# Patient Record
Sex: Male | Born: 1935 | Race: White | Hispanic: No | Marital: Married | Smoking: Former smoker
Health system: Southern US, Community
[De-identification: ages and names within clinical notes are randomized; demographics above are authoritative.]

## PROBLEM LIST (undated history)

## (undated) DIAGNOSIS — J939 Pneumothorax, unspecified: Secondary | ICD-10-CM

## (undated) DIAGNOSIS — M159 Polyosteoarthritis, unspecified: Secondary | ICD-10-CM

## (undated) DIAGNOSIS — N400 Enlarged prostate without lower urinary tract symptoms: Secondary | ICD-10-CM

## (undated) DIAGNOSIS — J479 Bronchiectasis, uncomplicated: Secondary | ICD-10-CM

## (undated) HISTORY — DX: Bronchiectasis, uncomplicated: J47.9

## (undated) HISTORY — PX: CATARACT EXTRACTION W/ INTRAOCULAR LENS  IMPLANT, BILATERAL: SHX1307

## (undated) HISTORY — DX: Benign prostatic hyperplasia without lower urinary tract symptoms: N40.0

## (undated) HISTORY — PX: TOTAL HIP ARTHROPLASTY: SHX124

## (undated) HISTORY — DX: Polyosteoarthritis, unspecified: M15.9

## (undated) HISTORY — DX: Pneumothorax, unspecified: J93.9

## (undated) HISTORY — PX: UMBILICAL HERNIA REPAIR: SHX196

## (undated) HISTORY — PX: TONSILLECTOMY: SUR1361

---

## 1966-09-29 HISTORY — PX: MENISCUS REPAIR: SHX5179

## 1993-09-29 DIAGNOSIS — J939 Pneumothorax, unspecified: Secondary | ICD-10-CM

## 1993-09-29 HISTORY — DX: Pneumothorax, unspecified: J93.9

## 2013-06-28 ENCOUNTER — Encounter: Payer: Self-pay | Admitting: Internal Medicine

## 2013-06-28 ENCOUNTER — Ambulatory Visit (INDEPENDENT_AMBULATORY_CARE_PROVIDER_SITE_OTHER): Payer: Medicare HMO | Admitting: Internal Medicine

## 2013-06-28 ENCOUNTER — Ambulatory Visit (INDEPENDENT_AMBULATORY_CARE_PROVIDER_SITE_OTHER)
Admission: RE | Admit: 2013-06-28 | Discharge: 2013-06-28 | Disposition: A | Discharge status: Home / Self Care | Payer: Medicare HMO | Source: Ambulatory Visit | Attending: Internal Medicine | Admitting: Internal Medicine

## 2013-06-28 VITALS — BP 140/70 | HR 89 | Temp 98.0°F | Resp 16 | Ht 70.0 in | Wt 203.0 lb

## 2013-06-28 DIAGNOSIS — J189 Pneumonia, unspecified organism: Secondary | ICD-10-CM | POA: Insufficient documentation

## 2013-06-28 DIAGNOSIS — R200 Anesthesia of skin: Secondary | ICD-10-CM

## 2013-06-28 DIAGNOSIS — M159 Polyosteoarthritis, unspecified: Secondary | ICD-10-CM | POA: Insufficient documentation

## 2013-06-28 DIAGNOSIS — J479 Bronchiectasis, uncomplicated: Secondary | ICD-10-CM | POA: Insufficient documentation

## 2013-06-28 DIAGNOSIS — R209 Unspecified disturbances of skin sensation: Secondary | ICD-10-CM

## 2013-06-28 DIAGNOSIS — J471 Bronchiectasis with (acute) exacerbation: Secondary | ICD-10-CM

## 2013-06-28 LAB — CBC WITH DIFFERENTIAL/PLATELET
Basophils Absolute: 0 10*3/uL (ref 0.0–0.1)
Eosinophils Relative: 3.9 % (ref 0.0–5.0)
MCV: 86.4 fl (ref 78.0–100.0)
Monocytes Absolute: 0.8 10*3/uL (ref 0.1–1.0)
Monocytes Relative: 10.4 % (ref 3.0–12.0)
Neutrophils Relative %: 69.7 % (ref 43.0–77.0)
Platelets: 304 10*3/uL (ref 150.0–400.0)
RDW: 14.1 % (ref 11.5–14.6)
WBC: 7.4 10*3/uL (ref 4.5–10.5)

## 2013-06-28 LAB — BASIC METABOLIC PANEL
BUN: 13 mg/dL (ref 6–23)
CO2: 25 mEq/L (ref 19–32)
Chloride: 103 mEq/L (ref 96–112)
Creatinine, Ser: 1 mg/dL (ref 0.4–1.5)
GFR: 73.18 mL/min (ref >60.00)
Potassium: 3.3 mEq/L — ABNORMAL LOW (ref 3.5–5.1)

## 2013-06-28 LAB — HEPATIC FUNCTION PANEL
AST: 21 U/L (ref 0–37)
Bilirubin, Direct: 0.1 mg/dL (ref 0.0–0.3)
Total Bilirubin: 0.6 mg/dL (ref 0.3–1.2)

## 2013-06-28 LAB — TSH: TSH: 0.82 u[IU]/mL (ref 0.35–5.50)

## 2013-06-28 LAB — VITAMIN B12: Vitamin B-12: 274 pg/mL (ref 211–911)

## 2013-06-28 MED ORDER — LEVOFLOXACIN 500 MG PO TABS: 500.0000 mg | ORAL_TABLET | Freq: Every day | ORAL | Status: DC

## 2013-06-28 MED ORDER — FLUTICASONE-SALMETEROL 250-50 MCG/DOSE IN AEPB: 1.0000 | INHALATION_SPRAY | Freq: Two times a day (BID) | RESPIRATORY_TRACT | Status: DC

## 2013-06-28 MED ORDER — DOXYCYCLINE MONOHYDRATE 100 MG PO TABS: 100.0000 mg | ORAL_TABLET | Freq: Two times a day (BID) | ORAL | Status: DC

## 2013-06-28 NOTE — Patient Instructions (Addendum)
Call if you are not better in the next couple of days.

## 2013-06-28 NOTE — Assessment & Plan Note (Signed)
Will check labs

## 2013-06-28 NOTE — Assessment & Plan Note (Addendum)
Has small infiltrate in LLL Other more chronic changes  Will change the antibiotic to levaquin Add doxy because of the bronchiectasis

## 2013-06-28 NOTE — Progress Notes (Signed)
Subjective:    Patient ID: Spencer Webster, male    DOB: 22-Jun-1928, 79 y.o.   MRN: [id number]  HPI Here with wife and service dog--- hearing Moved here ~2 months ago from [place]  Has chronic bronchiectasis  Pneumonia at age 10 Believes he got lung damage from this Diagnosed in ?72's Has albuterol for rescue On spiriva and advair daily--inconsistent with the spiriva cipro prescribed by [facility] doctor 4 days ago (was up there in [place]) Prone to pneumonia Did have sweat after starting the antibiotic Fever recurred next day and yesterday Still with some sweats at night Concerned that he is not improving like he normally does  No current outpatient prescriptions on file prior to visit.   No current facility-administered medications on file prior to visit.    Allergies  Allergen Reactions  . Morphine And Related   . Penicillins     Past Medical History  Diagnosis Date  . Bronchiectasis   . Osteoarthritis, multiple sites     Past Surgical History  Procedure Laterality Date  . Total hip arthroplasty  1999 & 2008  . Meniscus repair Left 1968  . Tonsillectomy      Family History  Problem Relation Age of Onset  . Cancer Father   . Heart disease Neg Hx   . Diabetes Neg Hx   . Hypertension Neg Hx     History   Social History  . Marital Status: Married    Spouse Name: N/A    Number of Children: 0  . Years of Education: N/A   Occupational History  . Sales of electronics     Retired 2013   Social History Main Topics  . Smoking status: Never Smoker   . Smokeless tobacco: Never Used  . Alcohol Use: No  . Drug Use: No  . Sexual Activity: Not on file   Other Topics Concern  . Not on file   Social History Narrative   No advanced directives   Review of Systems  Constitutional: Positive for fatigue and unexpected weight change.       Has lost some weight with this illness  HENT: Positive for hearing loss. Negative for tinnitus.   Eyes: Negative for visual  disturbance.       Wears reading glasses  Respiratory: Positive for cough and shortness of breath.   Cardiovascular: Negative for chest pain and palpitations.  Gastrointestinal: Negative for nausea and vomiting.       Bowels are "erratic"  Genitourinary: Negative for urgency, frequency and difficulty urinating.  Musculoskeletal: Positive for arthralgias. Negative for back pain.       Lots of knee pain  Skin: Negative for rash.  Neurological: Positive for numbness. Negative for dizziness, syncope, light-headedness and headaches.       Some numbness in feet  Psychiatric/Behavioral: Negative for sleep disturbance and dysphoric mood. The patient is not nervous/anxious.        Objective:   Physical Exam  Constitutional: He appears well-developed and well-nourished. No distress.  Neck: Normal range of motion. Neck supple.  Cardiovascular: Normal rate, regular rhythm and normal heart sounds.  Exam reveals no gallop.   No murmur heard. Pulmonary/Chest: Effort normal and breath sounds normal. No respiratory distress. He has no wheezes.  Very slight bibasilar crackles  Abdominal: Soft. There is no tenderness.  Musculoskeletal: He exhibits no edema and no tenderness.  Lymphadenopathy:    He has no cervical adenopathy.  Skin: No rash noted.  Psychiatric: He has a normal mood and  affect. His behavior is normal.          Assessment & Plan:

## 2013-06-28 NOTE — Assessment & Plan Note (Signed)
Seems to have secondary pneumonia also Will use doxy now Refill the advair

## 2013-06-28 NOTE — Assessment & Plan Note (Signed)
Especially bad in knees Uses aleve just prn Uses glucosamine/chondroitin

## 2013-06-29 NOTE — Addendum Note (Signed)
Addended by: Tillman Abide I on: 06/29/2013 08:43 AM   Modules accepted: Orders

## 2013-07-01 ENCOUNTER — Encounter: Payer: Self-pay | Admitting: *Deleted

## 2013-07-05 ENCOUNTER — Telehealth: Payer: Self-pay

## 2013-07-05 NOTE — Telephone Encounter (Signed)
Mrs South said pt was 06/28/13 with pneumonia; pt is still taking antibiotics; pt appeared to improve; but 07/05/13 during the night pt woke up with tshirt wet with sweat; temp was not taken. Pt does not feel warm now. Prod cough with white phlegm, no SOB more than usual. No CP. Mrs Oelkers concerned that woke up during the night sweating; thinks pt had fever last night. [facility] [address]

## 2013-07-05 NOTE — Telephone Encounter (Signed)
Probably needs to come in for recheck Add on tomorrow at this point He should continue the antibiotics

## 2013-07-06 NOTE — Telephone Encounter (Signed)
Pt coming in for OV 07/07/13

## 2013-07-07 ENCOUNTER — Encounter: Payer: Self-pay | Admitting: Internal Medicine

## 2013-07-07 ENCOUNTER — Ambulatory Visit (INDEPENDENT_AMBULATORY_CARE_PROVIDER_SITE_OTHER): Payer: Medicare HMO | Admitting: Internal Medicine

## 2013-07-07 VITALS — BP 112/68 | HR 86 | Temp 98.1°F | Wt 203.2 lb

## 2013-07-07 DIAGNOSIS — J479 Bronchiectasis, uncomplicated: Secondary | ICD-10-CM

## 2013-07-07 MED ORDER — LEVOFLOXACIN 500 MG PO TABS: 500.0000 mg | ORAL_TABLET | Freq: Every day | ORAL | Status: DC

## 2013-07-07 MED ORDER — PREDNISONE 20 MG PO TABS: 40.0000 mg | ORAL_TABLET | Freq: Every day | ORAL | Status: DC

## 2013-07-07 NOTE — Patient Instructions (Signed)
Please try guafenisin to loosen up the mucous If you worsen, with more sputum, go ahead and start the levafloxacin

## 2013-07-07 NOTE — Assessment & Plan Note (Signed)
Never quite got better but not clearly infected Will have him try mucolytic levaquin Rx if he worsens Brief prednisone dose-- has slight wheeze and ?small airway disease based on spirometry Will proceed with pulmonary referral

## 2013-07-07 NOTE — Progress Notes (Signed)
  Subjective:    Patient ID: Spencer Webster, male    DOB: 05-31-1928, 78 y.o.   MRN: [id number]  HPI Here with wife and service dog  Still getting sweats at night--but mild Usually produces a lot of sputum---but hasn't lately Feels heavy in the chest Getting easy dyspnea  May have gotten briefly better with the antibiotics after the last visit Coughing less-- "because I can't get it up" Hasn't used any mucolytics  No recorded fever but seems warm to wife May go up to 100 in evening  Current Outpatient Prescriptions on File Prior to Visit  Medication Sig Dispense Refill  . albuterol (PROAIR HFA) 108 (90 BASE) MCG/ACT inhaler Inhale 2 puffs into the lungs every 6 (six) hours as needed for wheezing.      Marland Kitchen doxycycline (ADOXA) 100 MG tablet Take 1 tablet (100 mg total) by mouth 2 (two) times daily.  20 tablet  0  . Fluticasone-Salmeterol (ADVAIR) 250-50 MCG/DOSE AEPB Inhale 1 puff into the lungs every 12 (twelve) hours.  60 each  11  . tiotropium (SPIRIVA) 18 MCG inhalation capsule Place 18 mcg into inhaler and inhale daily.       No current facility-administered medications on file prior to visit.    Allergies  Allergen Reactions  . Morphine And Related   . Penicillins     Past Medical History  Diagnosis Date  . Bronchiectasis   . Osteoarthritis, multiple sites     Past Surgical History  Procedure Laterality Date  . Total hip arthroplasty  1999 & 2008  . Meniscus repair Left 1968  . Tonsillectomy    . Cataract extraction w/ intraocular lens  implant, bilateral      Family History  Problem Relation Age of Onset  . Cancer Father   . Heart disease Neg Hx   . Diabetes Neg Hx   . Hypertension Neg Hx     History   Social History  . Marital Status: Married    Spouse Name: N/A    Number of Children: 0  . Years of Education: N/A   Occupational History  . Sales of electronics     Retired 2013   Social History Main Topics  . Smoking status: Never Smoker   .  Smokeless tobacco: Never Used  . Alcohol Use: No  . Drug Use: No  . Sexual Activity: Not on file   Other Topics Concern  . Not on file   Social History Narrative   No advanced directives   Review of Systems Appetite is fair Weight is stable since his last visit    Objective:   Physical Exam  Constitutional: He appears well-developed and well-nourished. No distress.  Pulmonary/Chest: Effort normal. No respiratory distress. He has wheezes. He has rales.  Very faint exp wheeze--doesn't seem tight Faint bibasilar crackles  Musculoskeletal: He exhibits no edema.          Assessment & Plan:

## 2013-07-26 ENCOUNTER — Encounter: Payer: Self-pay | Admitting: Pulmonary Disease

## 2013-07-26 ENCOUNTER — Ambulatory Visit (INDEPENDENT_AMBULATORY_CARE_PROVIDER_SITE_OTHER): Payer: Medicare HMO | Admitting: Pulmonary Disease

## 2013-07-26 VITALS — BP 108/74 | HR 83 | Temp 98.4°F | Ht 70.0 in | Wt 204.0 lb

## 2013-07-26 DIAGNOSIS — J479 Bronchiectasis, uncomplicated: Secondary | ICD-10-CM

## 2013-07-26 NOTE — Progress Notes (Signed)
Subjective:    Patient ID: Spencer Webster, male    DOB: 02/10/1928, 79 y.o.   MRN: [id number]  HPI  This is a very pleasant 79 year old male with bronchiectasis who just moved here from [facility] a few months ago. He is here today to establish care.  He had pneumonia at age 53 which was treated with no antibiotics. He said that he received an oxygen tent and ice baths. Ever since then he has had bronchiectasis. This has been treated with various modalities over the years. Interestingly he says he had something called pneumoperitoneum as a treatment for it as a child. He was also considered that he had a lung removed but fortunately this was never performed. He was treated heavily with penicillin over the years. He's never had a definitive answer for why he has bronchiectasis but has always been assumed that it was because of the episode of pneumonia. He has had multiple sinus infections over the years and recurrent bronchiectasis flares. Fortunately, he has not had to be hospitalized much during his adult life for respiratory problems. He has never had children but does not believe that he is sterile.  Most recently he has been treated with Advair and Spiriva which he thinks helped. He believes that the Advair is better than the Spiriva. He only uses the Spiriva occasionally. He only has to use albuterol about 2-3 times a month. He has tried Symbicort in the past and feels that it is about the same as Advair. When he is at his best he can't walk very far because of shortness of breath. Typically when he is singing at church she sometimes has to rest for a verse long in order to catch his breath. He coughs up sputum on a daily basis. Recently, he had pneumonia and stated that he could not get anything up during that episode.  He has had a collapsed lung in the past which required a chest tube on the right. This has only happened one time.  Typically, he responds well to ciprofloxacin for  bronchiectasis flares.   Past Medical History  Diagnosis Date  . Bronchiectasis   . Osteoarthritis, multiple sites      Family History  Problem Relation Age of Onset  . Cancer Father   . Heart disease Neg Hx   . Diabetes Neg Hx   . Hypertension Neg Hx      History   Social History  . Marital Status: Married    Spouse Name: N/A    Number of Children: 0  . Years of Education: N/A   Occupational History  . Sales of electronics     Retired 2013   Social History Main Topics  . Smoking status: Former Smoker -- 10 years    Types: Cigarettes    Quit date: 09/30/1967  . Smokeless tobacco: Never Used  . Alcohol Use: No  . Drug Use: No  . Sexual Activity: Not on file   Other Topics Concern  . Not on file   Social History Narrative   No advanced directives     Allergies  Allergen Reactions  . Morphine And Related   . Penicillins     Sweats      Outpatient Prescriptions Prior to Visit  Medication Sig Dispense Refill  . albuterol (PROAIR HFA) 108 (90 BASE) MCG/ACT inhaler Inhale 2 puffs into the lungs every 6 (six) hours as needed for wheezing.      . Fluticasone-Salmeterol (ADVAIR) 250-50 MCG/DOSE AEPB Inhale 1  puff into the lungs every 12 (twelve) hours.  60 each  11  . tiotropium (SPIRIVA) 18 MCG inhalation capsule Place 18 mcg into inhaler and inhale daily.      Marland Kitchen doxycycline (ADOXA) 100 MG tablet Take 1 tablet (100 mg total) by mouth 2 (two) times daily.  20 tablet  0  . levofloxacin (LEVAQUIN) 500 MG tablet Take 1 tablet (500 mg total) by mouth daily.  10 tablet  0  . predniSONE (DELTASONE) 20 MG tablet Take 2 tablets (40 mg total) by mouth daily.  9 tablet  0   No facility-administered medications prior to visit.      Review of Systems  Constitutional: Negative for fever, chills, activity change and appetite change.  HENT: Positive for congestion and sinus pressure. Negative for ear pain, hearing loss, postnasal drip, rhinorrhea and sneezing.   Eyes:  Negative for redness, itching and visual disturbance.  Respiratory: Positive for cough. Negative for chest tightness, shortness of breath and wheezing.   Cardiovascular: Negative for chest pain, palpitations and leg swelling.  Gastrointestinal: Negative for nausea, vomiting, abdominal pain, diarrhea, constipation, blood in stool and abdominal distention.  Musculoskeletal: Positive for arthralgias. Negative for gait problem, joint swelling, myalgias, neck pain and neck stiffness.  Skin: Negative for rash.  Neurological: Negative for dizziness, light-headedness, numbness and headaches.  Hematological: Does not bruise/bleed easily.  Psychiatric/Behavioral: Negative for confusion and dysphoric mood.       Objective:   Physical Exam Filed Vitals:   07/26/13 1437  BP: 108/74  Pulse: 83  Temp: 98.4 F (36.9 C)  TempSrc: Oral  Height: 5\' 10"  (1.778 m)  Weight: 204 lb (92.534 kg)  SpO2: 95%    Gen: well appearing, no acute distress HEENT: NCAT, PERRL, EOMi, OP clear, neck supple without masses PULM: Expiratory wheezes bilaterally, few crackles in the bases, good air movement CV: RRR, no mgr, no JVD AB: BS+, soft, nontender, no hsm Ext: warm, no edema, no clubbing, no cyanosis Derm: no rash or skin breakdown Neuro: A&Ox4, CN II-XII intact, strength 5/5 in all 4 extremities  October 2014 simple spirometry consistent with moderate airflow obstruction, ratio 67%, FEV1 2.13 L (68% predicted) 06/28/2013 chest x-ray reviewed, chronic changes likely consistent with bronchiectasis left base greater than right     Assessment & Plan:   Bronchiectasis He appears to have bronchiectasis due to pneumonia as a [age] year old.  I suspect that this is the only etiology, though given his lack of children and chronic sinus symptoms will send CFTR gene mutation for thoroughness.  At this point will not change therapy.  I do want him to provide [place] with a sputum specimen for culture.    Plan -continue  Advair -sputum culture -for now, use Cipro 750mg  po bid x 14 days for exacerbations -hold flutter valve as he uses postural drainage techniques -consider daily azithromycin -f/u 4-6 months   Updated Medication List Outpatient Encounter Prescriptions as of 07/26/2013  Medication Sig Dispense Refill  . albuterol (PROAIR HFA) 108 (90 BASE) MCG/ACT inhaler Inhale 2 puffs into the lungs every 6 (six) hours as needed for wheezing.      . Cholecalciferol (VITAMIN D PO) Take 1 tablet by mouth daily.      . Fluticasone-Salmeterol (ADVAIR) 250-50 MCG/DOSE AEPB Inhale 1 puff into the lungs every 12 (twelve) hours.  60 each  11  . Glucosamine HCl (GLUCOSAMINE PO) Take 1 tablet by mouth daily.      . Multiple Vitamin (MULTIVITAMIN) capsule  Take 1 capsule by mouth daily.      Marland Kitchen tiotropium (SPIRIVA) 18 MCG inhalation capsule Place 18 mcg into inhaler and inhale daily.      . [DISCONTINUED] doxycycline (ADOXA) 100 MG tablet Take 1 tablet (100 mg total) by mouth 2 (two) times daily.  20 tablet  0  . [DISCONTINUED] levofloxacin (LEVAQUIN) 500 MG tablet Take 1 tablet (500 mg total) by mouth daily.  10 tablet  0  . [DISCONTINUED] predniSONE (DELTASONE) 20 MG tablet Take 2 tablets (40 mg total) by mouth daily.  9 tablet  0   No facility-administered encounter medications on file as of 07/26/2013.

## 2013-07-26 NOTE — Assessment & Plan Note (Signed)
He appears to have bronchiectasis due to pneumonia as a [age] year old.  I suspect that this is the only etiology, though given his lack of children and chronic sinus symptoms will send CFTR gene mutation for thoroughness.  At this point will not change therapy.  I do want him to provide [place] with a sputum specimen for culture.    Plan -continue Advair -sputum culture -for now, use Cipro 750mg  po bid x 14 days for exacerbations -hold flutter valve as he uses postural drainage techniques -consider daily azithromycin -f/u 4-6 months

## 2013-07-26 NOTE — Patient Instructions (Signed)
Continue taking the Spiriva and the Advair as you are doing Bring [place] a sample of your mucus as soon as you can We will call you with the results of your culture and blood work  We will see you back in 6 months

## 2013-07-30 LAB — RESPIRATORY CULTURE OR RESPIRATORY AND SPUTUM CULTURE: Gram Stain: NONE SEEN

## 2013-07-31 LAB — LOWER RESPIRATORY CULTURE

## 2013-08-01 ENCOUNTER — Encounter: Payer: Self-pay | Admitting: Internal Medicine

## 2013-08-01 DIAGNOSIS — N400 Enlarged prostate without lower urinary tract symptoms: Secondary | ICD-10-CM | POA: Insufficient documentation

## 2013-08-02 ENCOUNTER — Encounter: Payer: Self-pay | Admitting: Pulmonary Disease

## 2013-08-05 ENCOUNTER — Telehealth: Payer: Self-pay | Admitting: *Deleted

## 2013-08-05 NOTE — Telephone Encounter (Signed)
Pt dropped of Sputum sample, but i didn't see any order?

## 2013-08-08 NOTE — Telephone Encounter (Signed)
Not sure why he brought a second sample.  We already have one on file. FYI, often I am working nights and won't see these messages.  Next time if this happens you might call the main pulmonary office in [place] to ask for immediate assistance.  Thanks [employer]

## 2013-08-09 NOTE — Telephone Encounter (Signed)
sure

## 2013-08-09 NOTE — Telephone Encounter (Signed)
Oh ok sorry about that i will do that next time  So can i throw away the sample?

## 2013-09-08 ENCOUNTER — Other Ambulatory Visit: Payer: Self-pay | Admitting: *Deleted

## 2013-09-08 MED ORDER — FLUTICASONE-SALMETEROL 250-50 MCG/DOSE IN AEPB: 1.0000 | INHALATION_SPRAY | Freq: Two times a day (BID) | RESPIRATORY_TRACT | Status: DC

## 2013-10-10 ENCOUNTER — Encounter: Payer: Self-pay | Admitting: Internal Medicine

## 2013-10-10 ENCOUNTER — Ambulatory Visit (INDEPENDENT_AMBULATORY_CARE_PROVIDER_SITE_OTHER): Payer: Medicare HMO | Admitting: Internal Medicine

## 2013-10-10 VITALS — BP 118/76 | HR 75 | Temp 97.7°F | Ht 69.25 in | Wt 204.2 lb

## 2013-10-10 DIAGNOSIS — J479 Bronchiectasis, uncomplicated: Secondary | ICD-10-CM

## 2013-10-10 DIAGNOSIS — N400 Enlarged prostate without lower urinary tract symptoms: Secondary | ICD-10-CM

## 2013-10-10 DIAGNOSIS — M159 Polyosteoarthritis, unspecified: Secondary | ICD-10-CM

## 2013-10-10 DIAGNOSIS — Z23 Encounter for immunization: Secondary | ICD-10-CM

## 2013-10-10 DIAGNOSIS — Z0000 Encounter for general adult medical examination without abnormal findings: Secondary | ICD-10-CM

## 2013-10-10 MED ORDER — ZOSTER VACCINE LIVE 19400 UNT/0.65ML ~~LOC~~ SOLR: 0.6500 mL | Freq: Once | SUBCUTANEOUS | Status: DC

## 2013-10-10 NOTE — Addendum Note (Signed)
Addended by: Sueanne MargaritaSMITH, Edrian Melucci L on: 10/10/2013 04:58 PM   Modules accepted: Orders, Medications

## 2013-10-10 NOTE — Assessment & Plan Note (Signed)
Aleve helps

## 2013-10-10 NOTE — Progress Notes (Signed)
Subjective:    Patient ID: Spencer Webster, male    DOB: 09-11-1928, 80 y.o.   MRN: [id number]  HPI Here for physical Here with wife  Did establish with Dr Leonides Sake the visit Wonders about Virgel Bouquet--- told him to discuss with Dr Kendrick Fries He wants prevnar vaccine  Current Outpatient Prescriptions on File Prior to Visit  Medication Sig Dispense Refill  . albuterol (PROAIR HFA) 108 (90 BASE) MCG/ACT inhaler Inhale 2 puffs into the lungs every 6 (six) hours as needed for wheezing.      . Cholecalciferol (VITAMIN D PO) Take 1 tablet by mouth daily.      . Fluticasone-Salmeterol (ADVAIR) 250-50 MCG/DOSE AEPB Inhale 1 puff into the lungs every 12 (twelve) hours.  180 each  3  . Glucosamine HCl (GLUCOSAMINE PO) Take 1 tablet by mouth daily.      . Multiple Vitamin (MULTIVITAMIN) capsule Take 1 capsule by mouth daily.      Marland Kitchen tiotropium (SPIRIVA) 18 MCG inhalation capsule Place 18 mcg into inhaler and inhale daily.       No current facility-administered medications on file prior to visit.    Allergies  Allergen Reactions  . Morphine And Related   . Penicillins     Sweats     Past Medical History  Diagnosis Date  . Bronchiectasis   . Osteoarthritis, multiple sites   . Pneumothorax on left 1995  . BPH (benign prostatic hypertrophy)     Past Surgical History  Procedure Laterality Date  . Total hip arthroplasty  1999 & 2008  . Meniscus repair Left 1968  . Tonsillectomy    . Cataract extraction w/ intraocular lens  implant, bilateral    . Umbilical hernia repair      Family History  Problem Relation Age of Onset  . Cancer Father   . Heart disease Neg Hx   . Diabetes Neg Hx   . Hypertension Neg Hx     History   Social History  . Marital Status: Married    Spouse Name: N/A    Number of Children: 0  . Years of Education: N/A   Occupational History  . Sales of electronics     Retired 2013   Social History Main Topics  . Smoking status: Former Smoker -- 10 years   Types: Cigarettes    Quit date: 09/30/1967  . Smokeless tobacco: Never Used  . Alcohol Use: No  . Drug Use: No  . Sexual Activity: Not on file   Other Topics Concern  . Not on file   Social History Narrative   No living will   Requests wife to make health care decisions   Would accept resuscitation   Not sure about tube feeds   Review of Systems  Constitutional: Negative for fatigue and unexpected weight change.       Wears seat belt  HENT: Positive for congestion. Negative for hearing loss and tinnitus.        Dentures  Eyes: Negative for visual disturbance.       Uses reading glasses  Respiratory: Positive for cough and shortness of breath. Negative for chest tightness.   Cardiovascular: Positive for leg swelling. Negative for chest pain and palpitations.       Legs swell if he sits with legs down  Gastrointestinal: Positive for constipation. Negative for nausea, vomiting, abdominal pain and blood in stool.       No heartburn Uses miralax  Endocrine: Positive for cold intolerance. Negative for heat intolerance.  Genitourinary: Negative for urgency.       Some nocturia Stream is okay  Musculoskeletal: Positive for arthralgias and back pain. Negative for myalgias.       Uses aleve once in a while as well as daily glucosamine  Skin: Negative for rash.       Liver spots No suspicious lesions  Allergic/Immunologic: Positive for environmental allergies. Negative for immunocompromised state.  Neurological: Positive for weakness. Negative for dizziness, syncope, light-headedness, numbness and headaches.       Mild generalized weakness  Hematological: Negative for adenopathy. Bruises/bleeds easily.  Psychiatric/Behavioral: Negative for sleep disturbance and dysphoric mood. The patient is not nervous/anxious.        Objective:   Physical Exam  Constitutional: He is oriented to person, place, and time. He appears well-developed and well-nourished. No distress.  HENT:  Head:  Normocephalic and atraumatic.  Right Ear: External ear normal.  Left Ear: External ear normal.  Mouth/Throat: Oropharynx is clear and moist. No oropharyngeal exudate.  Eyes: Conjunctivae and EOM are normal. Pupils are equal, round, and reactive to light.  Neck: Normal range of motion. Neck supple. No thyromegaly present.  Cardiovascular: Normal rate, regular rhythm, normal heart sounds and intact distal pulses.  Exam reveals no gallop.   No murmur heard. Pulmonary/Chest: Effort normal and breath sounds normal. No respiratory distress. He has no wheezes. He has no rales.  Abdominal: Soft. Bowel sounds are normal. There is no tenderness. There is no rebound and no guarding.  Musculoskeletal: He exhibits no edema and no tenderness.  Lymphadenopathy:    He has no cervical adenopathy.  Neurological: He is alert and oriented to person, place, and time.  Skin: No rash noted.  Psychiatric: He has a normal mood and affect. His behavior is normal.          Assessment & Plan:

## 2013-10-10 NOTE — Assessment & Plan Note (Signed)
Voids okay No meds needed at this point

## 2013-10-10 NOTE — Assessment & Plan Note (Signed)
Follows with Dr Kendrick FriesMcQuaid Doing well [place] now

## 2013-10-10 NOTE — Assessment & Plan Note (Signed)
Doing well Will give prevnar Rx for zostavax No cancer screening due to age

## 2013-10-10 NOTE — Progress Notes (Signed)
Pre-visit discussion using our clinic review tool. No additional management support is needed unless otherwise documented below in the visit note.  

## 2014-01-19 ENCOUNTER — Telehealth: Payer: Self-pay

## 2014-01-19 NOTE — Telephone Encounter (Signed)
Mrs Matilde HaymakerStead wanted to know if pt should contact Dr Kendrick FriesMcQuaid or Dr Alphonsus SiasLetvak for Spiriva refill; advised pts choice but pt said he is supposed to see Dr Kendrick FriesMcQuaid this month and will call his office for appt and refill.

## 2014-02-01 ENCOUNTER — Encounter: Payer: Self-pay | Admitting: Pulmonary Disease

## 2014-02-01 ENCOUNTER — Ambulatory Visit (INDEPENDENT_AMBULATORY_CARE_PROVIDER_SITE_OTHER): Payer: Commercial Managed Care - HMO | Admitting: Pulmonary Disease

## 2014-02-01 ENCOUNTER — Encounter (INDEPENDENT_AMBULATORY_CARE_PROVIDER_SITE_OTHER): Payer: Self-pay

## 2014-02-01 VITALS — BP 126/62 | HR 81 | Ht 71.0 in | Wt 194.0 lb

## 2014-02-01 DIAGNOSIS — J479 Bronchiectasis, uncomplicated: Secondary | ICD-10-CM

## 2014-02-01 NOTE — Progress Notes (Signed)
   Subjective:    Patient ID: Spencer PaulsJohn Webster, male    DOB: [birth year], 80 y.o.   MRN: [id number]  Synopsis : Bronchiectasis due to pneumonia as a child; first the [facility] in 2014. October 2014 simple spirometry consistent with moderate airflow obstruction, ratio 67%, FEV1 2.13 L (68% predicted) 07/2013 sputum culture > pan sensitive PSA, also strep pneumo  HPI  02/01/2014 ROV> Since the last visit Spencer RuizJohn went to [place] with his wife and state for several months. He has not had problems with his breathing since the last visit. Mucus production is up on Sundays down others. He uses Advair twice a day regularly. He will use Spiriva on days when he knows he can be talking more or walking more. Typically, he doesn't use the Spiriva on a daily basis however. He occasionally uses albuterol but not too often. He has not had any flares of bronchiectasis since the last visit. In general things are going okay.   Past Medical History  Diagnosis Date  . Bronchiectasis   . Osteoarthritis, multiple sites   . Pneumothorax on left 1995  . BPH (benign prostatic hypertrophy)      Review of Systems     Objective:   Physical Exam Filed Vitals:   02/01/14 1153  BP: 126/62  Pulse: 81  Height: 5\' 11"  (1.803 m)  Weight: 194 lb (87.998 kg)  SpO2: 96%  RA  Gen: well appearing, no acute distress HEENT: NCAT, EOMi, OP clear PULM: Rhonchi and wheezes bilaterally, good air movement CV: RRR, no mgr, no JVD AB: BS+, soft, nontender, no hsm Ext: warm, no edema, no clubbing, no cyanosis Derm: no rash or skin breakdown Neuro: A&Ox4, MAEW       Assessment & Plan:   Bronchiectasis This has been a stable interval for Spencer Webster. I think he gets more benefit from the Advair then the Spriva.  His most recent sputum culture grew pansensitive pseudomonas as well as strep pneumo. Right now he does not need treatment for that.  Plan: -Continue to stay active -Continue Advair twice a day -Continue  mucociliary clearance on a daily basis -If he develops an episode of bronchitis I would treat him with ciprofloxacin 750 mg every 12 hours x14 days -Followup with me in 6 months her surgeon if needed    Updated Medication List Outpatient Encounter Prescriptions as of 02/01/2014  Medication Sig  . albuterol (PROAIR HFA) 108 (90 BASE) MCG/ACT inhaler Inhale 2 puffs into the lungs every 6 (six) hours as needed for wheezing.  . Cholecalciferol (VITAMIN D PO) Take 1 tablet by mouth daily.  . Fluticasone-Salmeterol (ADVAIR) 250-50 MCG/DOSE AEPB Inhale 1 puff into the lungs every 12 (twelve) hours.  . Glucosamine HCl (GLUCOSAMINE PO) Take 1 tablet by mouth daily.  . Multiple Vitamin (MULTIVITAMIN) capsule Take 1 capsule by mouth daily.  Marland Kitchen. tiotropium (SPIRIVA) 18 MCG inhalation capsule Place 18 mcg into inhaler and inhale daily.  Marland Kitchen. zoster vaccine live, PF, (ZOSTAVAX) [id number] UNT/0.65ML injection Inject 19,400 Units into the skin once.

## 2014-02-01 NOTE — Patient Instructions (Signed)
Take the Advair and spiriva as you are doing We will try to track down the results of your CF test We will see you back in 6 months or sooner if needed

## 2014-02-01 NOTE — Assessment & Plan Note (Signed)
This has been a stable interval for Spencer Webster. I think he gets more benefit from the Advair then the Spriva.  His most recent sputum culture grew pansensitive pseudomonas as well as strep pneumo. Right now he does not need treatment for that.  Plan: -Continue to stay active -Continue Advair twice a day -Continue mucociliary clearance on a daily basis -If he develops an episode of bronchitis I would treat him with ciprofloxacin 750 mg every 12 hours x14 days -Followup with me in 6 months her surgeon if needed

## 2014-02-16 ENCOUNTER — Encounter: Payer: Self-pay | Admitting: Pulmonary Disease

## 2014-02-17 ENCOUNTER — Telehealth: Payer: Self-pay

## 2014-02-17 NOTE — Telephone Encounter (Signed)
Spoke with pt, he is aware of cf mutation study results.  Nothing further needed.

## 2014-02-17 NOTE — Telephone Encounter (Signed)
Message copied by Velvet Bathe on Fri Feb 17, 2014  9:42 AM ------      Message from: Max Fickle B      Created: Thu Feb 16, 2014 10:49 PM       A,            Please let him know that his CF mutation study was normal            Thanks      B ------

## 2014-03-29 ENCOUNTER — Encounter: Payer: Self-pay | Admitting: Pulmonary Disease

## 2014-04-25 ENCOUNTER — Encounter: Payer: Self-pay | Admitting: Internal Medicine

## 2014-04-25 ENCOUNTER — Ambulatory Visit (INDEPENDENT_AMBULATORY_CARE_PROVIDER_SITE_OTHER): Payer: Commercial Managed Care - HMO | Admitting: Internal Medicine

## 2014-04-25 VITALS — BP 110/70 | HR 72 | Temp 98.5°F | Wt 195.0 lb

## 2014-04-25 DIAGNOSIS — R0981 Nasal congestion: Secondary | ICD-10-CM | POA: Insufficient documentation

## 2014-04-25 DIAGNOSIS — M19019 Primary osteoarthritis, unspecified shoulder: Secondary | ICD-10-CM

## 2014-04-25 DIAGNOSIS — R609 Edema, unspecified: Secondary | ICD-10-CM

## 2014-04-25 DIAGNOSIS — J3489 Other specified disorders of nose and nasal sinuses: Secondary | ICD-10-CM

## 2014-04-25 DIAGNOSIS — M19012 Primary osteoarthritis, left shoulder: Secondary | ICD-10-CM

## 2014-04-25 NOTE — Patient Instructions (Signed)
Please use support socks (20-2230mm Hg) if you are going to be sitting or standing for a prolonged time. If your nasal congestion is bothersome enough, you can try over the counter Nasacort or Flonase.

## 2014-04-25 NOTE — Progress Notes (Signed)
Pre visit review using our clinic review tool, if applicable. No additional management support is needed unless otherwise documented below in the visit note. 

## 2014-04-25 NOTE — Assessment & Plan Note (Signed)
Probably mild allergies Would avoid antihistamine but he can try nasal steroid if bad enough

## 2014-04-25 NOTE — Assessment & Plan Note (Signed)
Clearly seems to be from venous insufficiency Discussed elevation, avoiding salt and support hose

## 2014-04-25 NOTE — Assessment & Plan Note (Signed)
Fair ROM Not bad enough to require surgical intervention

## 2014-04-25 NOTE — Progress Notes (Signed)
Subjective:    Patient ID: Spencer PaulsJohn Webster, male    DOB: November 09, 1927, 80 y.o.   MRN: [id number]  HPI Having some swelling in left ankle---if on it for a while or prolonged sitting Tries to elevate legs when sitting---that helps Right ankle may swell some but not much No pain Resolves by AM Uses a little added salt Has some varicose veins  Breathing is about the same Runs out of air if he talks a lot  Sleeps flat in bed No recent PND--- in past he just uses rescue inhaler (but not recently) No change in exercise tolerance  Having trouble with left shoulder Hears snap, crackle and pop when he moves it Sometimes can have sig pain  Since here--clear nasal drainage and down back of throat (here for about 1 year) Not sick Will blow nose and eventually can clear out a lot of mucus and breathe better Chronic sinus trouble  Current Outpatient Prescriptions on File Prior to Visit  Medication Sig Dispense Refill  . albuterol (PROAIR HFA) 108 (90 BASE) MCG/ACT inhaler Inhale 2 puffs into the lungs every 6 (six) hours as needed for wheezing.      . Cholecalciferol (VITAMIN D PO) Take 1 tablet by mouth daily.      . Fluticasone-Salmeterol (ADVAIR) 250-50 MCG/DOSE AEPB Inhale 1 puff into the lungs every 12 (twelve) hours.  180 each  3  . Glucosamine HCl (GLUCOSAMINE PO) Take 1 tablet by mouth daily.      . Multiple Vitamin (MULTIVITAMIN) capsule Take 1 capsule by mouth daily.      Marland Kitchen. tiotropium (SPIRIVA) 18 MCG inhalation capsule Place 18 mcg into inhaler and inhale daily.       No current facility-administered medications on file prior to visit.    Allergies  Allergen Reactions  . Morphine And Related   . Penicillins     Sweats     Past Medical History  Diagnosis Date  . Bronchiectasis   . Osteoarthritis, multiple sites   . Pneumothorax on left 1995  . BPH (benign prostatic hypertrophy)     Past Surgical History  Procedure Laterality Date  . Total hip arthroplasty  1999 & 2008    . Meniscus repair Left 1968  . Tonsillectomy    . Cataract extraction w/ intraocular lens  implant, bilateral    . Umbilical hernia repair      Family History  Problem Relation Age of Onset  . Cancer Father   . Heart disease Neg Hx   . Diabetes Neg Hx   . Hypertension Neg Hx     History   Social History  . Marital Status: Married    Spouse Name: N/A    Number of Children: 0  . Years of Education: N/A   Occupational History  . Sales of electronics     Retired 2013   Social History Main Topics  . Smoking status: Former Smoker -- 10 years    Types: Cigarettes    Quit date: 09/30/1967  . Smokeless tobacco: Never Used  . Alcohol Use: No  . Drug Use: No  . Sexual Activity: Not on file   Other Topics Concern  . Not on file   Social History Narrative   No living will   Requests wife to make health care decisions   Would accept resuscitation   Not sure about tube feeds   Review of Systems No chest pain No dizziness or syncope    Objective:   Physical Exam  Constitutional: He appears well-developed and well-nourished. No distress.  HENT:  Mild nasal congestion  Neck: Normal range of motion. Neck supple. No thyromegaly present.  Cardiovascular: Normal rate, regular rhythm, normal heart sounds and intact distal pulses.  Exam reveals no gallop.   No murmur heard. Pulmonary/Chest: Effort normal. No respiratory distress. He has no wheezes. He has no rales.  Slight rhonchi but otherwise clear  Musculoskeletal:  Trace edema in left ankle Superficial varicosities  Fairly normal passive ROM in left shoulder--but with moderate crepitus. Almost full active abduction Internal and external rotation are okay  Lymphadenopathy:    He has no cervical adenopathy.          Assessment & Plan:

## 2014-05-24 ENCOUNTER — Other Ambulatory Visit: Payer: Self-pay | Admitting: *Deleted

## 2014-05-24 MED ORDER — TIOTROPIUM BROMIDE MONOHYDRATE 18 MCG IN CAPS: 18.0000 ug | ORAL_CAPSULE | Freq: Every day | RESPIRATORY_TRACT | Status: DC

## 2014-06-06 ENCOUNTER — Other Ambulatory Visit: Payer: Self-pay

## 2014-06-06 NOTE — Telephone Encounter (Signed)
Spencer Webster left v/m requesting rx for advair and spiriva be faxed to [place] [facility] at [phone].Please advise.

## 2014-06-06 NOTE — Telephone Encounter (Signed)
This would need to be printed and mailed correct?

## 2014-06-07 MED ORDER — TIOTROPIUM BROMIDE MONOHYDRATE 18 MCG IN CAPS: 18.0000 ug | ORAL_CAPSULE | Freq: Every day | RESPIRATORY_TRACT | Status: DC

## 2014-06-07 MED ORDER — FLUTICASONE-SALMETEROL 250-50 MCG/DOSE IN AEPB: 1.0000 | INHALATION_SPRAY | Freq: Two times a day (BID) | RESPIRATORY_TRACT | Status: DC

## 2014-06-07 NOTE — Telephone Encounter (Signed)
Spoke with patient's wife and advised results  

## 2014-06-07 NOTE — Telephone Encounter (Signed)
I have printed. Let her know she has to send these since I am not licensed in [place]

## 2014-07-26 ENCOUNTER — Encounter: Payer: Self-pay | Admitting: Internal Medicine

## 2014-07-26 ENCOUNTER — Ambulatory Visit (INDEPENDENT_AMBULATORY_CARE_PROVIDER_SITE_OTHER): Payer: Commercial Managed Care - HMO | Admitting: Internal Medicine

## 2014-07-26 VITALS — BP 120/60 | HR 87 | Temp 98.0°F | Wt 195.0 lb

## 2014-07-26 DIAGNOSIS — M545 Low back pain, unspecified: Secondary | ICD-10-CM

## 2014-07-26 DIAGNOSIS — M549 Dorsalgia, unspecified: Secondary | ICD-10-CM | POA: Insufficient documentation

## 2014-07-26 NOTE — Assessment & Plan Note (Signed)
Lumbar and direct trauma to ribs Nothing to suggest fracture Discuss using heat Try aleve briefly also No signs of serious problem

## 2014-07-26 NOTE — Progress Notes (Signed)
Subjective:    Patient ID: Spencer PaulsJohn Webster, male    DOB: 1927/11/05, 80 y.o.   MRN: [id number]  HPI Had a fall about 1 week ago Climbing into RV and didn't have the electric steps down Trying to pull himself into the RV and went flying backwards Fell onto back and hip Slightly hit head Bad pain he didn't want to get up  Right knee pain Using elastic brace on this which is really helping  Mostly still has bilateral lumbar pain Hasn't gotten better since the fall really Tried some excedrin the first day--no meds since then No heat or ice  Not able to get around as much---deferring picking up RV due to pain Has trip planned next week Walks okay--but stiff No radiation of pain up back or into his legs  Current Outpatient Prescriptions on File Prior to Visit  Medication Sig Dispense Refill  . albuterol (PROAIR HFA) 108 (90 BASE) MCG/ACT inhaler Inhale 2 puffs into the lungs every 6 (six) hours as needed for wheezing.      . Cholecalciferol (VITAMIN D PO) Take 1 tablet by mouth daily.      . Fluticasone-Salmeterol (ADVAIR) 250-50 MCG/DOSE AEPB Inhale 1 puff into the lungs every 12 (twelve) hours.  180 each  3  . Glucosamine HCl (GLUCOSAMINE PO) Take 1 tablet by mouth daily.      . Multiple Vitamin (MULTIVITAMIN) capsule Take 1 capsule by mouth daily.      Marland Kitchen. tiotropium (SPIRIVA) 18 MCG inhalation capsule Place 1 capsule (18 mcg total) into inhaler and inhale daily.  90 capsule  3   No current facility-administered medications on file prior to visit.    Allergies  Allergen Reactions  . Morphine And Related   . Penicillins     Sweats     Past Medical History  Diagnosis Date  . Bronchiectasis   . Osteoarthritis, multiple sites   . Pneumothorax on left 1995  . BPH (benign prostatic hypertrophy)     Past Surgical History  Procedure Laterality Date  . Total hip arthroplasty  1999 & 2008  . Meniscus repair Left 1968  . Tonsillectomy    . Cataract extraction w/ intraocular lens   implant, bilateral    . Umbilical hernia repair      Family History  Problem Relation Age of Onset  . Cancer Father   . Heart disease Neg Hx   . Diabetes Neg Hx   . Hypertension Neg Hx     History   Social History  . Marital Status: Married    Spouse Name: N/A    Number of Children: 0  . Years of Education: N/A   Occupational History  . Sales of electronics     Retired 2013   Social History Main Topics  . Smoking status: Former Smoker -- 10 years    Types: Cigarettes    Quit date: 09/30/1967  . Smokeless tobacco: Never Used  . Alcohol Use: No  . Drug Use: No  . Sexual Activity: Not on file   Other Topics Concern  . Not on file   Social History Narrative   No living will   Requests wife to make health care decisions   Would accept resuscitation   Not sure about tube feeds   Review of Systems No leg weakness Bowels are "always messed up"--- but no recent change (needs laxative at times) No problems voiding No change in chronic cough Same DOE    Objective:   Physical  Exam  Pulmonary/Chest: Effort normal and breath sounds normal. No respiratory distress. He has no wheezes. He has no rales.  Musculoskeletal:  No spine tenderness Slight tenderness along bilateral lower ribs Hips move normally No sig right knee swelling but some pain with ROM  Neurological:  No focal weakness--- 4/5 at hips and 4+/5 otherwise Very stiff and slow gait          Assessment & Plan:

## 2014-07-26 NOTE — Progress Notes (Signed)
Pre visit review using our clinic review tool, if applicable. No additional management support is needed unless otherwise documented below in the visit note. 

## 2014-07-26 NOTE — Patient Instructions (Signed)
Please try heat on your back regularly. You can use aleve (naproxen) 1-2 tabs twice a day with food over the next 2 weeks to help the pain.

## 2014-08-10 ENCOUNTER — Telehealth: Payer: Self-pay

## 2014-08-10 DIAGNOSIS — M545 Low back pain, unspecified: Secondary | ICD-10-CM

## 2014-08-10 NOTE — Telephone Encounter (Signed)
Mrs Spencer Webster left v/m; pt was seen 07/26/14 for lower back pain; pt is continuing to hurt in lt lower back and request xray. Please advise.

## 2014-08-10 NOTE — Telephone Encounter (Signed)
Okay to set him up for lumbar spine x-rays to evaluate persistent pain

## 2014-08-11 NOTE — Telephone Encounter (Signed)
Per Camelia Engerri pt can come anytime for x-ray between 9-4, x-ray just needs to be ordered and I will call pt.

## 2014-08-17 ENCOUNTER — Telehealth: Payer: Self-pay | Admitting: Internal Medicine

## 2014-08-17 NOTE — Telephone Encounter (Signed)
Called wife and stated pt doesn't need a referral to Dr. Corey SkainsMcQuaids office because pt has already been seen there, wife just wanted the number.

## 2014-08-17 NOTE — Telephone Encounter (Signed)
Pt's wife, Earley AbideHilda, calling in upset. Would like a referral to Dr Kendrick FriesMcQuaid? Says husband fell and has not been right since. Says she never heard back and would just like a referral. Please advise. Thank you!

## 2014-08-17 NOTE — Telephone Encounter (Signed)
Well, please give her the number (if not already done)

## 2014-08-17 NOTE — Telephone Encounter (Signed)
Spoke with patient's wife and advised results  

## 2014-08-18 ENCOUNTER — Encounter: Payer: Self-pay | Admitting: Pulmonary Disease

## 2014-08-18 ENCOUNTER — Ambulatory Visit (INDEPENDENT_AMBULATORY_CARE_PROVIDER_SITE_OTHER): Payer: Commercial Managed Care - HMO | Admitting: Pulmonary Disease

## 2014-08-18 ENCOUNTER — Ambulatory Visit (INDEPENDENT_AMBULATORY_CARE_PROVIDER_SITE_OTHER)
Admission: RE | Admit: 2014-08-18 | Discharge: 2014-08-18 | Disposition: A | Discharge status: Home / Self Care | Payer: Commercial Managed Care - HMO | Source: Ambulatory Visit | Attending: Internal Medicine | Admitting: Internal Medicine

## 2014-08-18 VITALS — BP 134/72 | HR 74 | Ht 71.0 in | Wt 199.0 lb

## 2014-08-18 DIAGNOSIS — R0602 Shortness of breath: Secondary | ICD-10-CM

## 2014-08-18 DIAGNOSIS — M545 Low back pain, unspecified: Secondary | ICD-10-CM

## 2014-08-18 DIAGNOSIS — J479 Bronchiectasis, uncomplicated: Secondary | ICD-10-CM

## 2014-08-18 MED ORDER — FLUTTER DEVI: Status: DC

## 2014-08-18 NOTE — Patient Instructions (Signed)
Use the incentive spirometer 5-10 breaths 8-10 times per day Use the flutter valve 10 breaths 3-4 times per day Use postural drainage techniques Use mucinex twice a day as directed  These techniques should help you produce more mucus which is good right now.  However if you are feeling worse (more shortness of breath, fever, pain, blood in mucus) then call me and I will send a prescription for Cipro.  We will see you back in March or April, whenever you come back from [place]

## 2014-08-18 NOTE — Progress Notes (Signed)
Subjective:    Patient ID: Spencer Webster, male    DOB: [birth year], 80 y.o.   MRN: [id number]  Synopsis : Bronchiectasis due to pneumonia as a child; first the [facility] in 2014. October 2014 simple spirometry consistent with moderate airflow obstruction, ratio 67%, FEV1 2.13 L (68% predicted) 07/2013 sputum culture > pan sensitive PSA, also strep pneumo  HPI  Chief Complaint  Patient presents with  . Acute Visit    Pt c/o worsening sob X1 week.  also having prod cough with yellow mucus.      08/18/2014 ROV> Spencer Webster is here today because he hasn't been feeling well.  For the last few weeks (about a month) he had a fall backwards and hurt his back.  He has had some soreness in his back which has been fairly persistent.  He has ben dyspneic since then.  Specifically when he talks to much, sings, or gets anxious he "runs out of air".  He has been coughing lately but he has not been producing as much mucus as he is used to.  He continues to take Advair twice a day, Spiriva daily.    Past Medical History  Diagnosis Date  . Bronchiectasis   . Osteoarthritis, multiple sites   . Pneumothorax on left 1995  . BPH (benign prostatic hypertrophy)      Review of Systems      Objective:   Physical Exam  Filed Vitals:   08/18/14 0917  BP: 134/72  Pulse: 74  Height: 5\' 11"  (1.803 m)  Weight: 199 lb (90.266 kg)  SpO2: 97%  RA  Gen: well appearing, no acute distress HEENT: NCAT, EOMi, OP clear PULM: Rhonchi and wheezes bilaterally, good air movement CV: RRR, no mgr, no JVD AB: BS+, soft, nontender, no hsm Ext: warm, no edema, no clubbing, no cyanosis Derm: no rash or skin breakdown Neuro: A&Ox4, MAEW       Assessment & Plan:   Bronchiectasis He describes some mild increase in dyspnea as well as chest tightness after he fell a few weeks ago. Because he has not been producing more mucus, his weight has been stable and he has not had a fever I do not think he has had an  exacerbation of bronchiectasis at this point. It is very likely that because of splinting he has been holding on to more mucus and that has made him more short of breath.  Plan: -Increase mucociliary clearance measures for the next few days, flutter valve, incentive spirometer, postural drainage  -continue inhaled therapies -use Mucinex regularly for the next few days  -if no improvement or if his shortness of breath gets worse or if he gets a fever or if he has hemoptysis then call me and we will call in a prescription for Cipro    Updated Medication List Outpatient Encounter Prescriptions as of 08/18/2014  Medication Sig  . albuterol (PROAIR HFA) 108 (90 BASE) MCG/ACT inhaler Inhale 2 puffs into the lungs every 6 (six) hours as needed for wheezing.  . Cholecalciferol (VITAMIN D PO) Take 1 tablet by mouth daily.  . Fluticasone-Salmeterol (ADVAIR) 250-50 MCG/DOSE AEPB Inhale 1 puff into the lungs every 12 (twelve) hours.  . Multiple Vitamin (MULTIVITAMIN) capsule Take 1 capsule by mouth daily.  Marland Kitchen. tiotropium (SPIRIVA) 18 MCG inhalation capsule Place 1 capsule (18 mcg total) into inhaler and inhale daily.  . Glucosamine HCl (GLUCOSAMINE PO) Take 1 tablet by mouth daily.  Marland Kitchen. Respiratory Therapy Supplies (FLUTTER) DEVI Use as directed

## 2014-08-18 NOTE — Assessment & Plan Note (Signed)
He describes some mild increase in dyspnea as well as chest tightness after he fell a few weeks ago. Because he has not been producing more mucus, his weight has been stable and he has not had a fever I do not think he has had an exacerbation of bronchiectasis at this point. It is very likely that because of splinting he has been holding on to more mucus and that has made him more short of breath.  Plan: -Increase mucociliary clearance measures for the next few days, flutter valve, incentive spirometer, postural drainage  -continue inhaled therapies -use Mucinex regularly for the next few days  -if no improvement or if his shortness of breath gets worse or if he gets a fever or if he has hemoptysis then call me and we will call in a prescription for Cipro

## 2014-08-21 NOTE — Progress Notes (Signed)
Quick Note:  Spoke with pt and relayed results. States that between the mucinex, flutter valve, and incentive spirometer, he is getting up a lot of mucus which is making him feel better. He doesn't feel like he needs an abx at this time. I advised him if he isn't improving or feels worse in the next few days to call the office. Nothing further needed at this time. Just FYI. ______

## 2014-10-26 IMAGING — CR DG CHEST 2V
2 series · 2 of 2 positions shown · non-contrast
Comparison: Wound

CLINICAL DATA: Cough and fever.

EXAM:
CHEST  2 VIEW

[view not recorded (1 of 2)]
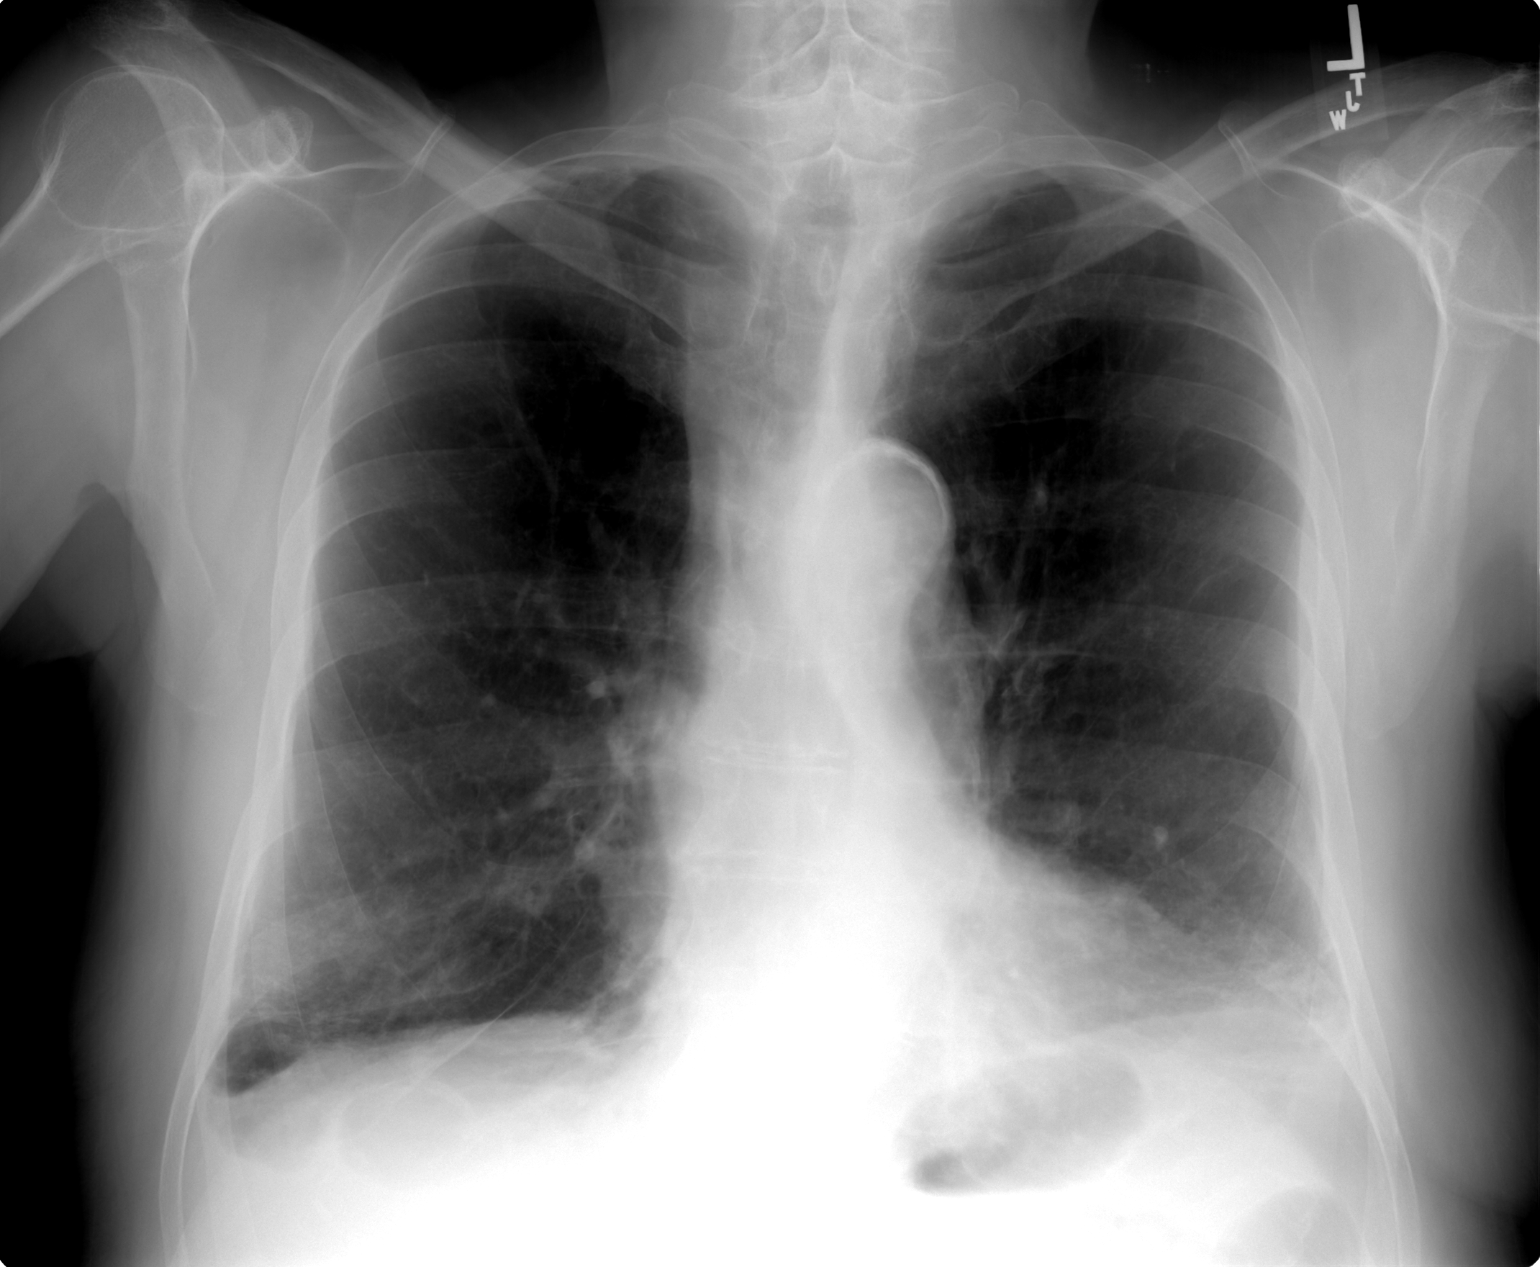

[view not recorded (2 of 2)]
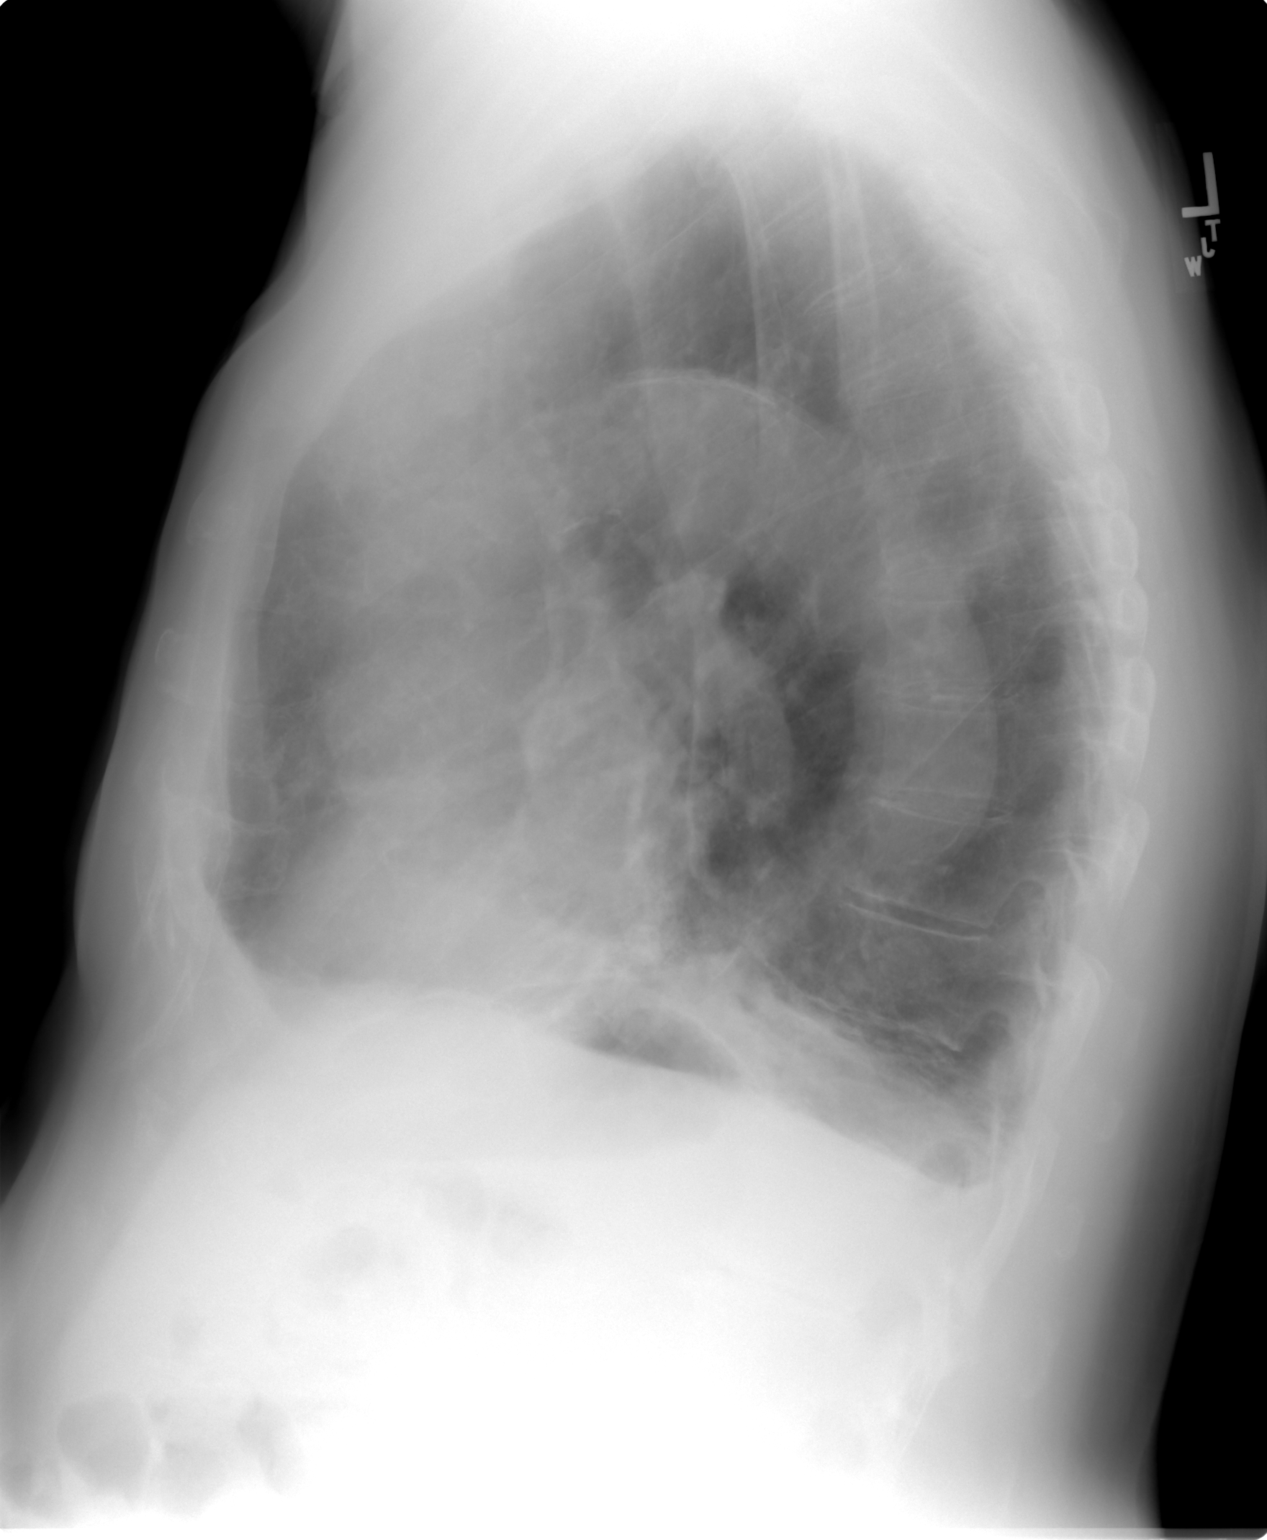

[2 of 2 positions shown; findings below may reference images not displayed]

FINDINGS: Moderate hyperinflation. Midline trachea. Normal heart size with a
tortuous atherosclerotic transverse and descending aorta. Blunting
costophrenic angles on the frontal is likely due to pleural
thickening bilaterally. Biapical pleural thickening as well. Lower
lobe predominant volume loss and patchy opacities. Somewhat more
confluent opacity medially at the left lung base. Upper lobes clear.
IMPRESSION: Hyperinflation with left greater than right bibasilar opacities.
Favored to represent primarily scarring. Especially at the left lung
base, early acute infection is difficult to exclude (without priors
for comparison). Consider short term radiographic followup.

## 2014-12-21 ENCOUNTER — Other Ambulatory Visit: Payer: Self-pay | Admitting: Internal Medicine

## 2015-02-20 ENCOUNTER — Ambulatory Visit (INDEPENDENT_AMBULATORY_CARE_PROVIDER_SITE_OTHER): Payer: Commercial Managed Care - HMO | Admitting: Internal Medicine

## 2015-02-20 ENCOUNTER — Encounter: Payer: Self-pay | Admitting: Internal Medicine

## 2015-02-20 VITALS — BP 116/72 | HR 68 | Temp 96.8°F | Ht 71.0 in | Wt 198.4 lb

## 2015-02-20 DIAGNOSIS — H539 Unspecified visual disturbance: Secondary | ICD-10-CM

## 2015-02-20 DIAGNOSIS — M545 Low back pain, unspecified: Secondary | ICD-10-CM

## 2015-02-20 NOTE — Progress Notes (Signed)
Pre visit review using our clinic review tool, if applicable. No additional management support is needed unless otherwise documented below in the visit note. 

## 2015-02-20 NOTE — Addendum Note (Signed)
Addended by: Tillman AbideLETVAK, Tajanae Guilbault I on: 02/20/2015 12:36 PM   Modules accepted: Orders

## 2015-02-20 NOTE — Patient Instructions (Signed)
[facility]

## 2015-02-20 NOTE — Progress Notes (Signed)
Subjective:    Patient ID: Spencer Webster, male    DOB: 1928-02-28, 81 y.o.   MRN: [id number]  HPI [place] in [place] Here with wife Has some concerns before his wellness visit  Had a fall in October---hurt back then Still sore  Both sides  Bad enough he can't bend down to pick up his little dog Sold RV because he can't maintain it now Tries to do exercise--but mostly just housework No set exercise program--considering Silver Sneakers Uses aleve if bad-- it helps some No abdominal problems with this  Bad knees and hips No focal leg weakness  Current Outpatient Prescriptions on File Prior to Visit  Medication Sig Dispense Refill  . ADVAIR DISKUS 250-50 MCG/DOSE AEPB INHALE ONE DOSE BY MOUTH EVERY 12 HOURS 180 each 0  . albuterol (PROAIR HFA) 108 (90 BASE) MCG/ACT inhaler Inhale 2 puffs into the lungs every 6 (six) hours as needed for wheezing.    . Cholecalciferol (VITAMIN D PO) Take 1 tablet by mouth daily.    . Glucosamine HCl (GLUCOSAMINE PO) Take 1 tablet by mouth daily.    . Multiple Vitamin (MULTIVITAMIN) capsule Take 1 capsule by mouth daily.    Marland Kitchen. Respiratory Therapy Supplies (FLUTTER) DEVI Use as directed 1 each 0  . tiotropium (SPIRIVA) 18 MCG inhalation capsule Place 1 capsule (18 mcg total) into inhaler and inhale daily. 90 capsule 3   No current facility-administered medications on file prior to visit.    Allergies  Allergen Reactions  . Morphine And Related   . Penicillins     Sweats     Past Medical History  Diagnosis Date  . Bronchiectasis   . Osteoarthritis, multiple sites   . Pneumothorax on left 1995  . BPH (benign prostatic hypertrophy)     Past Surgical History  Procedure Laterality Date  . Total hip arthroplasty  1999 & 2008  . Meniscus repair Left 1968  . Tonsillectomy    . Cataract extraction w/ intraocular lens  implant, bilateral    . Umbilical hernia repair      Family History  Problem Relation Age of Onset  . Cancer Father     . Heart disease Neg Hx   . Diabetes Neg Hx   . Hypertension Neg Hx     History   Social History  . Marital Status: Married    Spouse Name: N/A  . Number of Children: 0  . Years of Education: N/A   Occupational History  . Sales of electronics     Retired 2013   Social History Main Topics  . Smoking status: Former Smoker -- 10 years    Types: Cigarettes    Quit date: 09/30/1967  . Smokeless tobacco: Never Used  . Alcohol Use: No  . Drug Use: No  . Sexual Activity: Not on file   Other Topics Concern  . Not on file   Social History Narrative   No living will   Requests wife to make health care decisions   Would accept resuscitation   Not sure about tube feeds   Review of Systems Past cataracts---due for eye exam. May have slight vision decline Chronic constipation    Objective:   Physical Exam  Musculoskeletal:  No spine tenderness Mild bilateral lumbar muscle tenderness Fair ROM in hips SLR negative for back (legs tight)  Neurological:  Slow gait but not antalgic No focal weakness but generally weak--trouble getting up on table  Assessment & Plan:

## 2015-02-20 NOTE — Assessment & Plan Note (Signed)
Seems to be muscular Discussed starting resistance and core work Aleve prn

## 2015-03-13 ENCOUNTER — Encounter: Payer: Commercial Managed Care - HMO | Admitting: Internal Medicine

## 2015-03-16 ENCOUNTER — Ambulatory Visit (INDEPENDENT_AMBULATORY_CARE_PROVIDER_SITE_OTHER): Payer: Commercial Managed Care - HMO | Admitting: Internal Medicine

## 2015-03-16 ENCOUNTER — Encounter: Payer: Self-pay | Admitting: Internal Medicine

## 2015-03-16 VITALS — BP 128/70 | HR 78 | Temp 98.2°F | Resp 16 | Wt 194.0 lb

## 2015-03-16 DIAGNOSIS — J471 Bronchiectasis with (acute) exacerbation: Secondary | ICD-10-CM | POA: Diagnosis not present

## 2015-03-16 MED ORDER — DOXYCYCLINE HYCLATE 100 MG PO TABS: 100.0000 mg | ORAL_TABLET | Freq: Two times a day (BID) | ORAL | Status: DC

## 2015-03-16 NOTE — Patient Instructions (Signed)
If you feel better quickly, you can stop the antibiotic after 7 days.

## 2015-03-16 NOTE — Progress Notes (Signed)
Pre visit review using our clinic review tool, if applicable. No additional management support is needed unless otherwise documented below in the visit note. 

## 2015-03-16 NOTE — Progress Notes (Signed)
   Subjective:    Patient ID: Spencer Webster, male    DOB: November 23, 1927, 81 y.o.   MRN: [id number]  HPI Here with wife ---having respiratory problems Started with cough-- productive of purulent sputum Started ~2 weeks ago Wife gave levofloxacin 500mg  3 days---stopped due to stomach trouble with it  Sputum has cleared somewhat but then worsened  Dark and tenacious sputum  No fever Breathing is "fair" Doesn't feel overly ill  Current Outpatient Prescriptions on File Prior to Visit  Medication Sig Dispense Refill  . ADVAIR DISKUS 250-50 MCG/DOSE AEPB INHALE ONE DOSE BY MOUTH EVERY 12 HOURS 180 each 0  . albuterol (PROAIR HFA) 108 (90 BASE) MCG/ACT inhaler Inhale 2 puffs into the lungs every 6 (six) hours as needed for wheezing.    . Cholecalciferol (VITAMIN D PO) Take 1 tablet by mouth daily.    . Glucosamine HCl (GLUCOSAMINE PO) Take 1 tablet by mouth daily.    . Multiple Vitamin (MULTIVITAMIN) capsule Take 1 capsule by mouth daily.    . polyethylene glycol (MIRALAX / GLYCOLAX) packet Take 17 g by mouth daily.    Marland Kitchen Respiratory Therapy Supplies (FLUTTER) DEVI Use as directed 1 each 0  . tiotropium (SPIRIVA) 18 MCG inhalation capsule Place 1 capsule (18 mcg total) into inhaler and inhale daily. 90 capsule 3   No current facility-administered medications on file prior to visit.    Allergies  Allergen Reactions  . Morphine And Related   . Penicillins     Sweats     Past Medical History  Diagnosis Date  . Bronchiectasis   . Osteoarthritis, multiple sites   . Pneumothorax on left 1995  . BPH (benign prostatic hypertrophy)     Past Surgical History  Procedure Laterality Date  . Total hip arthroplasty  1999 & 2008  . Meniscus repair Left 1968  . Tonsillectomy    . Cataract extraction w/ intraocular lens  implant, bilateral    . Umbilical hernia repair      Family History  Problem Relation Age of Onset  . Cancer Father   . Heart disease Neg Hx   . Diabetes Neg Hx   .  Hypertension Neg Hx     History   Social History  . Marital Status: Married    Spouse Name: N/A  . Number of Children: 0  . Years of Education: N/A   Occupational History  . Sales of electronics     Retired 2013   Social History Main Topics  . Smoking status: Former Smoker -- 10 years    Types: Cigarettes    Quit date: 09/30/1967  . Smokeless tobacco: Never Used  . Alcohol Use: No  . Drug Use: No  . Sexual Activity: Not on file   Other Topics Concern  . Not on file   Social History Narrative   No living will   Requests wife to make health care decisions   Would accept resuscitation   Not sure about tube feeds   Review of Systems  Appetite is okay No vomiting or diarrhea Actually has constipation--uses miralax when necessary     Objective:   Physical Exam  Constitutional: He appears well-developed and well-nourished. No distress.  HENT:  Mouth/Throat: Oropharynx is clear and moist. No oropharyngeal exudate.  Pulmonary/Chest: Effort normal and breath sounds normal. No respiratory distress. He has no wheezes. He has no rales.  No dullness          Assessment & Plan:

## 2015-03-16 NOTE — Assessment & Plan Note (Signed)
No signs of pneumonia Didn't tolerate the levaquin Will try doxy May want to rotate with azithromycin and augmentin 7 days probably enough

## 2015-04-24 ENCOUNTER — Telehealth: Payer: Self-pay | Admitting: Internal Medicine

## 2015-04-24 MED ORDER — FLUTICASONE-SALMETEROL 250-50 MCG/DOSE IN AEPB: 1.0000 | INHALATION_SPRAY | Freq: Two times a day (BID) | RESPIRATORY_TRACT | Status: DC

## 2015-04-24 NOTE — Telephone Encounter (Signed)
Spouse called to get a refill on advair they would like to get a 3 month rx [facility] [address] They were having problems with phone she could not hear me.  Please let pt know when this has been called in

## 2015-04-24 NOTE — Telephone Encounter (Signed)
rx sent to pharmacy by e-script  

## 2015-07-04 ENCOUNTER — Ambulatory Visit (INDEPENDENT_AMBULATORY_CARE_PROVIDER_SITE_OTHER)
Admission: RE | Admit: 2015-07-04 | Discharge: 2015-07-04 | Disposition: A | Discharge status: Home / Self Care | Payer: Commercial Managed Care - HMO | Source: Ambulatory Visit | Attending: Internal Medicine | Admitting: Internal Medicine

## 2015-07-04 ENCOUNTER — Ambulatory Visit (INDEPENDENT_AMBULATORY_CARE_PROVIDER_SITE_OTHER): Payer: Commercial Managed Care - HMO | Admitting: Internal Medicine

## 2015-07-04 ENCOUNTER — Encounter: Payer: Self-pay | Admitting: Internal Medicine

## 2015-07-04 VITALS — BP 148/80 | HR 76 | Temp 98.2°F | Wt 197.0 lb

## 2015-07-04 DIAGNOSIS — Z0000 Encounter for general adult medical examination without abnormal findings: Secondary | ICD-10-CM

## 2015-07-04 DIAGNOSIS — N400 Enlarged prostate without lower urinary tract symptoms: Secondary | ICD-10-CM

## 2015-07-04 DIAGNOSIS — J470 Bronchiectasis with acute lower respiratory infection: Secondary | ICD-10-CM

## 2015-07-04 DIAGNOSIS — Z7189 Other specified counseling: Secondary | ICD-10-CM

## 2015-07-04 DIAGNOSIS — J471 Bronchiectasis with (acute) exacerbation: Secondary | ICD-10-CM

## 2015-07-04 LAB — COMPREHENSIVE METABOLIC PANEL
ALT: 15 U/L (ref 0–53)
AST: 20 U/L (ref 0–37)
Albumin: 4.1 g/dL (ref 3.5–5.2)
Alkaline Phosphatase: 88 U/L (ref 39–117)
BUN: 11 mg/dL (ref 6–23)
CO2: 27 mEq/L (ref 19–32)
Calcium: 9.5 mg/dL (ref 8.4–10.5)
Chloride: 102 mEq/L (ref 96–112)
Creatinine, Ser: 0.94 mg/dL (ref 0.40–1.50)
GFR: 80.94 mL/min (ref >60.00)
GLUCOSE: 78 mg/dL (ref 70–99)
POTASSIUM: 4.4 meq/L (ref 3.5–5.1)
SODIUM: 139 meq/L (ref 135–145)
TOTAL PROTEIN: 8.3 g/dL (ref 6.0–8.3)
Total Bilirubin: 0.5 mg/dL (ref 0.2–1.2)

## 2015-07-04 LAB — CBC WITH DIFFERENTIAL/PLATELET
Basophils Absolute: 0 10*3/uL (ref 0.0–0.1)
Basophils Relative: 0.3 % (ref 0.0–3.0)
Eosinophils Absolute: 0.2 10*3/uL (ref 0.0–0.7)
Eosinophils Relative: 1.1 % (ref 0.0–5.0)
HCT: 47.4 % (ref 39.0–52.0)
Hemoglobin: 15.7 g/dL (ref 13.0–17.0)
Lymphocytes Relative: 7.4 % — ABNORMAL LOW (ref 12.0–46.0)
Lymphs Abs: 1.2 10*3/uL (ref 0.7–4.0)
MCHC: 33.2 g/dL (ref 30.0–36.0)
MCV: 88.7 fl (ref 78.0–100.0)
Monocytes Absolute: 1.2 10*3/uL — ABNORMAL HIGH (ref 0.1–1.0)
Monocytes Relative: 7.1 % (ref 3.0–12.0)
Neutro Abs: 14 10*3/uL — ABNORMAL HIGH (ref 1.4–7.7)
Neutrophils Relative %: 84.1 % — ABNORMAL HIGH (ref 43.0–77.0)
Platelets: 299 10*3/uL (ref 150.0–400.0)
RBC: 5.34 Mil/uL (ref 4.22–5.81)
RDW: 14.3 % (ref 11.5–15.5)
WBC: 16.7 10*3/uL — ABNORMAL HIGH (ref 4.0–10.5)

## 2015-07-04 LAB — T4, FREE: Free T4: 0.96 ng/dL (ref 0.60–1.60)

## 2015-07-04 MED ORDER — ZOSTER VACCINE LIVE 19400 UNT/0.65ML ~~LOC~~ SOLR: 0.6500 mL | Freq: Once | SUBCUTANEOUS | Status: DC

## 2015-07-04 MED ORDER — CEFTRIAXONE SODIUM 1 G IJ SOLR
1.0000 g | Freq: Once | INTRAMUSCULAR | Status: AC
  Administered 2015-07-04: 1 g via INTRAMUSCULAR

## 2015-07-04 MED ORDER — DOXYCYCLINE HYCLATE 100 MG PO TABS: 100.0000 mg | ORAL_TABLET | Freq: Two times a day (BID) | ORAL | Status: DC

## 2015-07-04 NOTE — Assessment & Plan Note (Signed)
Chronic problems Sick today--will check CXR now

## 2015-07-04 NOTE — Assessment & Plan Note (Signed)
Has chronic lung changes May have new RLL infiltrate Rocephin given To ER if worsens--discussed with wife Will treat with doxy again (couldn't tolerate levaquin) See tomorrow--- may need ongoing parenteral Rx

## 2015-07-04 NOTE — Progress Notes (Signed)
Pre visit review using our clinic review tool, if applicable. No additional management support is needed unless otherwise documented below in the visit note. 

## 2015-07-04 NOTE — Patient Instructions (Signed)
Please call 911 if your breathing is getting worse.

## 2015-07-04 NOTE — Assessment & Plan Note (Signed)
No major problems  No Rx for now

## 2015-07-04 NOTE — Assessment & Plan Note (Signed)
I have personally reviewed the Medicare Annual Wellness questionnaire and have noted 1. The patient's medical and social history 2. Their use of alcohol, tobacco or illicit drugs 3. Their current medications and supplements 4. The patient's functional ability including ADL's, fall risks, home safety risks and hearing or visual             impairment. 5. Diet and physical activities 6. Evidence for depression or mood disorders  The patients weight, height, BMI and visual acuity have been recorded in the chart I have made referrals, counseling and provided education to the patient based review of the above and I have provided the pt with a written personalized care plan for preventive services.  I have provided you with a copy of your personalized plan for preventive services. Please take the time to review along with your updated medication list.  Defer flu shot--sick today No cancer screening due to age Will send zostavax rx again

## 2015-07-04 NOTE — Addendum Note (Signed)
Addended by: Sueanne Margarita on: 07/04/2015 01:15 PM   Modules accepted: Orders

## 2015-07-04 NOTE — Progress Notes (Signed)
Subjective:    Patient ID: Spencer Webster, male    DOB: 08/07/1928, 81 y.o.   MRN: [id number]  HPI Here for Medicare wellness and follow up on chronic medical conditions Wife is here Reviewed form and advanced directives Reviewed other doctors No tobacco or alcohol No regular exercise Independent with instrumental ADLs Still enjoys regular travel---but got rid of RV Vision okay--but early macular degeneration. On vitamins for this Hearing is fair 2 falls this year-- minor injury with one No depression or anhedonia  Not feeling well Started yesterday Having chills Cough but no increase in chronic sputum---yellowish Some SOB No rash or skin issues No dysuria or increased frequency/urgency  Fell 3 days ago No apparent injury  Noticed "bones aching" yesterday  Voids okay Nocturia x 1 usually (twice rarely) No usual daytime problems  Current Outpatient Prescriptions on File Prior to Visit  Medication Sig Dispense Refill  . albuterol (PROAIR HFA) 108 (90 BASE) MCG/ACT inhaler Inhale 2 puffs into the lungs every 6 (six) hours as needed for wheezing.    . Cholecalciferol (VITAMIN D PO) Take 1 tablet by mouth daily.    . Fluticasone-Salmeterol (ADVAIR DISKUS) 250-50 MCG/DOSE AEPB Inhale 1 puff into the lungs 2 (two) times daily. 180 each 3  . Glucosamine HCl (GLUCOSAMINE PO) Take 1 tablet by mouth daily.    . Multiple Vitamin (MULTIVITAMIN) capsule Take 1 capsule by mouth daily.    . polyethylene glycol (MIRALAX / GLYCOLAX) packet Take 17 g by mouth daily.    Marland Kitchen Respiratory Therapy Supplies (FLUTTER) DEVI Use as directed 1 each 0  . tiotropium (SPIRIVA) 18 MCG inhalation capsule Place 1 capsule (18 mcg total) into inhaler and inhale daily. 90 capsule 3   No current facility-administered medications on file prior to visit.    Allergies  Allergen Reactions  . Morphine And Related   . Penicillins     Sweats     Past Medical History  Diagnosis Date  . Bronchiectasis (HCC)     . Osteoarthritis, multiple sites   . Pneumothorax on left 1995  . BPH (benign prostatic hypertrophy)     Past Surgical History  Procedure Laterality Date  . Total hip arthroplasty  1999 & 2008  . Meniscus repair Left 1968  . Tonsillectomy    . Cataract extraction w/ intraocular lens  implant, bilateral    . Umbilical hernia repair      Family History  Problem Relation Age of Onset  . Cancer Father   . Heart disease Neg Hx   . Diabetes Neg Hx   . Hypertension Neg Hx     Social History   Social History  . Marital Status: Married    Spouse Name: N/A  . Number of Children: 0  . Years of Education: N/A   Occupational History  . Sales of electronics     Retired 2013   Social History Main Topics  . Smoking status: Former Smoker -- 10 years    Types: Cigarettes    Quit date: 09/30/1967  . Smokeless tobacco: Never Used  . Alcohol Use: No  . Drug Use: No  . Sexual Activity: Not on file   Other Topics Concern  . Not on file   Social History Narrative   No living will   Requests wife to make health care decisions   Would accept resuscitation   Would not want tube feeds if cognitively unaware   Review of Systems Sleeps well in general Weight stable Appetite is  good Bowels are "off and on" all my life. Uses [place] prn No significant back pain now or arthritis    Objective:   Physical Exam  Constitutional: He is oriented to person, place, and time. He appears well-developed and well-nourished.  chillls Tachypnea just getting up on table-- 26-28 Seems SOB  HENT:  Mouth/Throat: Oropharynx is clear and moist. No oropharyngeal exudate.  Neck: Normal range of motion. Neck supple. No thyromegaly present.  Pulmonary/Chest: Effort normal. No respiratory distress. He has no wheezes. He has no rales.  Decreased breath sounds but clear No dullness  Abdominal: Soft. There is no tenderness.  Musculoskeletal: He exhibits no edema or tenderness.  Lymphadenopathy:    He  has no cervical adenopathy.  Neurological: He is alert and oriented to person, place, and time.  President-- "Obama, ?" [phone]  Not good at math D-l-...Marland KitchenMarland KitchenMarland Kitchen Recall 1/3  Chilled and not feeling well today  Skin: No rash noted.  Psychiatric: He has a normal mood and affect. His behavior is normal.          Assessment & Plan:

## 2015-07-04 NOTE — Assessment & Plan Note (Signed)
See social history 

## 2015-07-05 ENCOUNTER — Encounter: Payer: Self-pay | Admitting: Internal Medicine

## 2015-07-05 ENCOUNTER — Ambulatory Visit (INDEPENDENT_AMBULATORY_CARE_PROVIDER_SITE_OTHER): Payer: Commercial Managed Care - HMO | Admitting: Internal Medicine

## 2015-07-05 VITALS — BP 130/70 | HR 74 | Temp 98.2°F | Resp 16 | Wt 198.0 lb

## 2015-07-05 DIAGNOSIS — Z23 Encounter for immunization: Secondary | ICD-10-CM

## 2015-07-05 DIAGNOSIS — J471 Bronchiectasis with (acute) exacerbation: Secondary | ICD-10-CM

## 2015-07-05 MED ORDER — CEFTRIAXONE SODIUM 1 G IJ SOLR
1.0000 g | Freq: Once | INTRAMUSCULAR | Status: AC
  Administered 2015-07-05: 1 g via INTRAMUSCULAR

## 2015-07-05 NOTE — Progress Notes (Signed)
Pre visit review using our clinic review tool, if applicable. No additional management support is needed unless otherwise documented below in the visit note. 

## 2015-07-05 NOTE — Assessment & Plan Note (Signed)
He is much better. Clinically he had distress and findings of pneumonia though the CXR was officially called just chronic scarring. Was very sick yesterday but looks a lot better Will give rocephin again and finish out the doxy

## 2015-07-05 NOTE — Progress Notes (Signed)
Subjective:    Patient ID: Spencer Webster, male    DOB: 04-25-1928, 81 y.o.   MRN: [id number]  HPI Here with wife Feels better but not back to normal  Did have drenching sweat last night--then felt better Still feels weak Feels his breathing is about back to normal---has easy dyspnea anyway No clear cut fever  Current Outpatient Prescriptions on File Prior to Visit  Medication Sig Dispense Refill  . albuterol (PROAIR HFA) 108 (90 BASE) MCG/ACT inhaler Inhale 2 puffs into the lungs every 6 (six) hours as needed for wheezing.    . Cholecalciferol (VITAMIN D PO) Take 1 tablet by mouth daily.    Marland Kitchen doxycycline (VIBRA-TABS) 100 MG tablet Take 1 tablet (100 mg total) by mouth 2 (two) times daily. 20 tablet 1  . Fluticasone-Salmeterol (ADVAIR DISKUS) 250-50 MCG/DOSE AEPB Inhale 1 puff into the lungs 2 (two) times daily. 180 each 3  . Glucosamine HCl (GLUCOSAMINE PO) Take 1 tablet by mouth daily.    . Multiple Vitamin (MULTIVITAMIN) capsule Take 1 capsule by mouth daily.    . polyethylene glycol (MIRALAX / GLYCOLAX) packet Take 17 g by mouth daily.    Marland Kitchen Respiratory Therapy Supplies (FLUTTER) DEVI Use as directed 1 each 0  . tiotropium (SPIRIVA) 18 MCG inhalation capsule Place 1 capsule (18 mcg total) into inhaler and inhale daily. 90 capsule 3  . zoster vaccine live, PF, (ZOSTAVAX) 40981 UNT/0.65ML injection Inject 19,400 Units into the skin once. 1 each 0   No current facility-administered medications on file prior to visit.    Allergies  Allergen Reactions  . Morphine And Related   . Penicillins     Sweats     Past Medical History  Diagnosis Date  . Bronchiectasis (HCC)   . Osteoarthritis, multiple sites   . Pneumothorax on left 1995  . BPH (benign prostatic hypertrophy)     Past Surgical History  Procedure Laterality Date  . Total hip arthroplasty  1999 & 2008  . Meniscus repair Left 1968  . Tonsillectomy    . Cataract extraction w/ intraocular lens  implant, bilateral    .  Umbilical hernia repair      Family History  Problem Relation Age of Onset  . Cancer Father   . Heart disease Neg Hx   . Diabetes Neg Hx   . Hypertension Neg Hx     Social History   Social History  . Marital Status: Married    Spouse Name: N/A  . Number of Children: 0  . Years of Education: N/A   Occupational History  . Sales of electronics     Retired 2013   Social History Main Topics  . Smoking status: Former Smoker -- 10 years    Types: Cigarettes    Quit date: 09/30/1967  . Smokeless tobacco: Never Used  . Alcohol Use: No  . Drug Use: No  . Sexual Activity: Not on file   Other Topics Concern  . Not on file   Social History Narrative   No living will   Requests wife to make health care decisions   Would accept resuscitation   Would not want tube feeds if cognitively unaware   Review of Systems Appetite still off-- but picked up some in the evening No nausea or vomiting     Objective:   Physical Exam  Neck: Normal range of motion. Neck supple.  Pulmonary/Chest: Effort normal. He has no wheezes.  Better air movement Slight LLL crackles  Lymphadenopathy:  He has no cervical adenopathy.          Assessment & Plan:

## 2015-07-05 NOTE — Addendum Note (Signed)
Addended by: Sueanne Margarita on: 07/05/2015 03:13 PM   Modules accepted: Orders

## 2015-08-03 ENCOUNTER — Encounter: Payer: Self-pay | Admitting: Internal Medicine

## 2015-08-03 ENCOUNTER — Ambulatory Visit (INDEPENDENT_AMBULATORY_CARE_PROVIDER_SITE_OTHER): Payer: Commercial Managed Care - HMO | Admitting: Internal Medicine

## 2015-08-03 VITALS — BP 142/80 | HR 92 | Temp 97.6°F | Resp 20 | Wt 204.5 lb

## 2015-08-03 DIAGNOSIS — J181 Lobar pneumonia, unspecified organism: Secondary | ICD-10-CM | POA: Diagnosis not present

## 2015-08-03 NOTE — Progress Notes (Signed)
Pre visit review using our clinic review tool, if applicable. No additional management support is needed unless otherwise documented below in the visit note. 

## 2015-08-03 NOTE — Progress Notes (Signed)
Subjective:    Patient ID: Spencer PaulsJohn Webster, male    DOB: Jan 17, 1928, 81 y.o.   MRN: [id number]  HPI Here with wife  Did seem to recover from infection last month Went to [place] and started getting sick again 1 week ago in evening-- started feeling "rotten" Shaking chills Worsened the next day and family called rescue (was less responsive)  Found to be dehydrated, having skipped beats Told he had LLL pneumonia Given steroids  Now on augmentin and cipro Albuterol by breathing treatments  Feels very weak still--hard even to shower Breathing is okay--just a little shallow No fever No more shaking chills  Current Outpatient Prescriptions on File Prior to Visit  Medication Sig Dispense Refill  . albuterol (PROAIR HFA) 108 (90 BASE) MCG/ACT inhaler Inhale 2 puffs into the lungs every 6 (six) hours as needed for wheezing.    . Cholecalciferol (VITAMIN D PO) Take 1 tablet by mouth daily.    . Fluticasone-Salmeterol (ADVAIR DISKUS) 250-50 MCG/DOSE AEPB Inhale 1 puff into the lungs 2 (two) times daily. 180 each 3  . Glucosamine HCl (GLUCOSAMINE PO) Take 1 tablet by mouth daily.    . Multiple Vitamin (MULTIVITAMIN) capsule Take 1 capsule by mouth daily.    . polyethylene glycol (MIRALAX / GLYCOLAX) packet Take 17 g by mouth daily.    Marland Kitchen. Respiratory Therapy Supplies (FLUTTER) DEVI Use as directed 1 each 0  . tiotropium (SPIRIVA) 18 MCG inhalation capsule Place 1 capsule (18 mcg total) into inhaler and inhale daily. 90 capsule 3  . zoster vaccine live, PF, (ZOSTAVAX) [id number] UNT/0.65ML injection Inject 19,400 Units into the skin once. 1 each 0   No current facility-administered medications on file prior to visit.    Allergies  Allergen Reactions  . Morphine And Related   . Penicillins     Sweats     Past Medical History  Diagnosis Date  . Bronchiectasis (HCC)   . Osteoarthritis, multiple sites   . Pneumothorax on left 1995  . BPH (benign prostatic hypertrophy)     Past  Surgical History  Procedure Laterality Date  . Total hip arthroplasty  1999 & 2008  . Meniscus repair Left 1968  . Tonsillectomy    . Cataract extraction w/ intraocular lens  implant, bilateral    . Umbilical hernia repair      Family History  Problem Relation Age of Onset  . Cancer Father   . Heart disease Neg Hx   . Diabetes Neg Hx   . Hypertension Neg Hx     Social History   Social History  . Marital Status: Married    Spouse Name: N/A  . Number of Children: 0  . Years of Education: N/A   Occupational History  . Sales of electronics     Retired 2013   Social History Main Topics  . Smoking status: Former Smoker -- 10 years    Types: Cigarettes    Quit date: 09/30/1967  . Smokeless tobacco: Never Used  . Alcohol Use: No  . Drug Use: No  . Sexual Activity: Not on file   Other Topics Concern  . Not on file   Social History Narrative   No living will   Requests wife to make health care decisions   Would accept resuscitation   Would not want tube feeds if cognitively unaware   Review of Systems  No vomiting or diarrhea Appetite still not right     Objective:   Physical Exam  Constitutional:  He appears well-developed and well-nourished. No distress.  Neck: Normal range of motion. Neck supple. No thyromegaly present.  Cardiovascular: Normal rate, regular rhythm and normal heart sounds.  Exam reveals no gallop.   No murmur heard. Pulmonary/Chest: Effort normal. No respiratory distress. He has no wheezes.  Find bibasilar early inspiratory crackles--- left >right No dullness  Musculoskeletal: He exhibits no edema.  Lymphadenopathy:    He has no cervical adenopathy.  Psychiatric: He has a normal mood and affect. His behavior is normal.          Assessment & Plan:

## 2015-08-03 NOTE — Assessment & Plan Note (Signed)
Abnormal LLL at baseline-- ?bronchietasis there more On appropriate antibiotics Slow improvement expected Will recheck in 1 month--- plans trip to [place] for January/February. Shouldn't travel too soon though

## 2015-09-04 ENCOUNTER — Ambulatory Visit (INDEPENDENT_AMBULATORY_CARE_PROVIDER_SITE_OTHER): Payer: Commercial Managed Care - HMO | Admitting: Internal Medicine

## 2015-09-04 ENCOUNTER — Ambulatory Visit (INDEPENDENT_AMBULATORY_CARE_PROVIDER_SITE_OTHER)
Admission: RE | Admit: 2015-09-04 | Discharge: 2015-09-04 | Disposition: A | Discharge status: Home / Self Care | Payer: Commercial Managed Care - HMO | Source: Ambulatory Visit | Attending: Internal Medicine | Admitting: Internal Medicine

## 2015-09-04 ENCOUNTER — Encounter: Payer: Self-pay | Admitting: Internal Medicine

## 2015-09-04 VITALS — BP 128/80 | HR 83 | Temp 98.0°F | Wt 201.0 lb

## 2015-09-04 DIAGNOSIS — J479 Bronchiectasis, uncomplicated: Secondary | ICD-10-CM | POA: Diagnosis not present

## 2015-09-04 DIAGNOSIS — J181 Lobar pneumonia, unspecified organism: Secondary | ICD-10-CM

## 2015-09-04 MED ORDER — AMOXICILLIN-POT CLAVULANATE 875-125 MG PO TABS: 1.0000 | ORAL_TABLET | Freq: Two times a day (BID) | ORAL | Status: DC

## 2015-09-04 NOTE — Assessment & Plan Note (Signed)
Discussed alternatives---like monthly alternating antibiotics. He would like to avoid any unnecessary antibiotics Will give augmentin Rx to start if he has early symptoms

## 2015-09-04 NOTE — Progress Notes (Signed)
Pre visit review using our clinic review tool, if applicable. No additional management support is needed unless otherwise documented below in the visit note. 

## 2015-09-04 NOTE — Progress Notes (Signed)
   Subjective:    Patient ID: Spencer Webster, male    DOB: 04-02-1928, 81 y.o.   MRN: [id number]  HPI Here for follow up of pneumonia With wife and service dog  Feels mostly better Still with cough and slight sputum--not purulent Mild clear rhinorrhea No fever Some SOB--with exertion Has been walking some--rests when needed Still has to rest briefly on landing (he relates this to his knees though)  Still plans to drive down to [place] by New Years Renting house [place]  Current Outpatient Prescriptions on File Prior to Visit  Medication Sig Dispense Refill  . albuterol (PROAIR HFA) 108 (90 BASE) MCG/ACT inhaler Inhale 2 puffs into the lungs every 6 (six) hours as needed for wheezing.    . Cholecalciferol (VITAMIN D PO) Take 1 tablet by mouth daily.    . Fluticasone-Salmeterol (ADVAIR DISKUS) 250-50 MCG/DOSE AEPB Inhale 1 puff into the lungs 2 (two) times daily. 180 each 3  . Glucosamine HCl (GLUCOSAMINE PO) Take 1 tablet by mouth daily.    . Multiple Vitamin (MULTIVITAMIN) capsule Take 1 capsule by mouth daily.    . polyethylene glycol (MIRALAX / GLYCOLAX) packet Take 17 g by mouth daily.    Marland Kitchen. Respiratory Therapy Supplies (FLUTTER) DEVI Use as directed 1 each 0  . tiotropium (SPIRIVA) 18 MCG inhalation capsule Place 1 capsule (18 mcg total) into inhaler and inhale daily. 90 capsule 3   No current facility-administered medications on file prior to visit.    Allergies  Allergen Reactions  . Morphine And Related   . Penicillins     Sweats     Past Medical History  Diagnosis Date  . Bronchiectasis (HCC)   . Osteoarthritis, multiple sites   . Pneumothorax on left 1995  . BPH (benign prostatic hypertrophy)     Past Surgical History  Procedure Laterality Date  . Total hip arthroplasty  1999 & 2008  . Meniscus repair Left 1968  . Tonsillectomy    . Cataract extraction w/ intraocular lens  implant, bilateral    . Umbilical hernia repair      Family History    Problem Relation Age of Onset  . Cancer Father   . Heart disease Neg Hx   . Diabetes Neg Hx   . Hypertension Neg Hx     Social History   Social History  . Marital Status: Married    Spouse Name: N/A  . Number of Children: 0  . Years of Education: N/A   Occupational History  . Sales of electronics     Retired 2013   Social History Main Topics  . Smoking status: Former Smoker -- 10 years    Types: Cigarettes    Quit date: 09/30/1967  . Smokeless tobacco: Never Used  . Alcohol Use: No  . Drug Use: No  . Sexual Activity: Not on file   Other Topics Concern  . Not on file   Social History Narrative   No living will   Requests wife to make health care decisions   Would accept resuscitation   Would not want tube feeds if cognitively unaware   Review of Systems  Appetite is good Sleeping fine     Objective:   Physical Exam  Constitutional: He appears well-developed. No distress.  Pulmonary/Chest: Effort normal. No respiratory distress. He has no wheezes. He has no rales.  Slight bibasilar rhonchi---no crackles now though          Assessment & Plan:

## 2015-09-04 NOTE — Assessment & Plan Note (Signed)
Clinically better Will recheck CXR--though I don't expect it to be normal

## 2015-09-05 ENCOUNTER — Encounter: Payer: Self-pay | Admitting: *Deleted

## 2015-09-05 ENCOUNTER — Telehealth: Payer: Self-pay | Admitting: Internal Medicine

## 2015-09-05 NOTE — Telephone Encounter (Signed)
Pt returned your call.  

## 2016-02-26 ENCOUNTER — Encounter: Payer: Self-pay | Admitting: Pulmonary Disease

## 2016-02-26 ENCOUNTER — Ambulatory Visit (INDEPENDENT_AMBULATORY_CARE_PROVIDER_SITE_OTHER): Payer: Commercial Managed Care - HMO | Admitting: Pulmonary Disease

## 2016-02-26 ENCOUNTER — Encounter (INDEPENDENT_AMBULATORY_CARE_PROVIDER_SITE_OTHER): Payer: Self-pay

## 2016-02-26 VITALS — BP 112/72 | HR 94 | Ht 71.0 in | Wt 207.0 lb

## 2016-02-26 DIAGNOSIS — J479 Bronchiectasis, uncomplicated: Secondary | ICD-10-CM

## 2016-02-26 MED ORDER — FLUTICASONE-SALMETEROL 232-14 MCG/ACT IN AEPB: 1.0000 | INHALATION_SPRAY | Freq: Every day | RESPIRATORY_TRACT | Status: DC

## 2016-02-26 NOTE — Patient Instructions (Signed)
We will have you take the generic form of Advair Take Spiriva every day no matter what Exercise every day Turning a sample of your mucus to your primary care physician's office We will see you back in 3 months or sooner if needed

## 2016-02-26 NOTE — Progress Notes (Signed)
Subjective:    Patient ID: Spencer Webster, male    DOB: November 02, 1927, 82 y.o.   MRN: [id number]  Synopsis : Bronchiectasis due to pneumonia as a child; first the [place] [facility] in 2014. October 2014 simple spirometry consistent with moderate airflow obstruction, ratio 67%, FEV1 2.13 L (68% predicted) 07/2013 sputum culture > pan sensitive PSA, also strep pneumo  HPI Chief Complaint  Patient presents with  . Follow-up    last seen 07/2014- c/o increased sob with any exertion, prod cough with white/yellow/green mucus.     Hunt wants me to "make him feel better".  He says that he runs out of air a lot faster than he used to.  He says that when he is talking he has to take longer pauses than he did years ago.  He says that the dyspnea has been more present on exertion recently.  When he is sedentary he doesn't have dyspnea.  Talking, climbing stairs will make him dyspneic.     He has had several episodes of respiratory infections since I saw him last.  He was diagnosed ith pneumonia when he was traveling to [place] a while back.  He thinks that he has had pneumonia twice in the last few years.  The episode in [place] required a hospitalization for a few days.    He continues to produce green mucus regularly.  This has been about the same as when I saw him years ago.  This definitely is worse with acute illnesses.  He is currently not doing any formal mucociliary clearance methods.  He will forcefully produce mucus with coughing throughout the day.  He is not participating in a formal exercise routine right now.  He hasn't been walking at all.  He continues to take Advair regularly.  He only uses Spiriva 2-3 times per week.  He feels that Spiriva helps when he takes it.  Past Medical History  Diagnosis Date  . Bronchiectasis (HCC)   . Osteoarthritis, multiple sites   . Pneumothorax on left 1995  . BPH (benign prostatic hypertrophy)      Review of Systems  Constitutional: Positive for  fatigue. Negative for fever and chills.  HENT: Negative for postnasal drip, rhinorrhea and sinus pressure.   Respiratory: Positive for cough and shortness of breath. Negative for wheezing.   Cardiovascular: Negative for chest pain, palpitations and leg swelling.       Objective:   Physical Exam Filed Vitals:   02/26/16 1550  BP: 112/72  Pulse: 94  Height:  (1.803 m)  Weight: 207 lb (93.895 kg)  SpO2: 96%  RA  Gen: well appearing, no acute distress HEENT: NCAT, EOMi, OP clear PULM: Rhonchi and wheezes bilaterally, good air movement CV: RRR, no mgr, no JVD AB: BS+, soft, nontender, no hsm Ext: warm, no edema, SOME CLUBBING NOTED TODAY, no cyanosis Derm: no rash or skin breakdown Neuro: A&Ox4, MAEW  Records from Dr. Alphonsus Sias reviewed     Assessment & Plan:   Bronchiectasis [facility]) He has been experiencing more frequent exacerbations and increasing symptoms. To some degree this is in keeping with the natural history of bronchiectasis but I think if he would be more purposeful about mucociliary clearance measures and not just relying on his cough he would see improve symptoms.  Further, he has been noncompliant with his medications and so I think he would see some improvement in symptoms if he were to take his medications regularly and exercise.  Plan: Sample mucus for  fungal, bacterial, AFB organisms Change Advair to the generic form I advised him to use Spiriva on a daily basis not as needed Exercise more regularly Ambulatory oximetry testing in the office today  Greater than 25 minutes spent in direct contact with the patient today  Follow-up 3 months    Updated Medication List Outpatient Encounter Prescriptions as of 02/26/2016  Medication Sig  . Cholecalciferol (VITAMIN D PO) Take 1 tablet by mouth daily.  . Fluticasone-Salmeterol (ADVAIR DISKUS) 250-50 MCG/DOSE AEPB Inhale 1 puff into the lungs 2 (two) times daily.  . Glucosamine HCl (GLUCOSAMINE PO) Take 1  tablet by mouth daily.  . Multiple Vitamin (MULTIVITAMIN) capsule Take 1 capsule by mouth daily.  . multivitamin-lutein (OCUVITE-LUTEIN) CAPS capsule Take 1 capsule by mouth daily.  . polyethylene glycol (MIRALAX / GLYCOLAX) packet Take 17 g by mouth daily as needed.   . tiotropium (SPIRIVA) 18 MCG inhalation capsule Place 1 capsule (18 mcg total) into inhaler and inhale daily. (Patient taking differently: Place 18 mcg into inhaler and inhale daily as needed. )  . albuterol (PROAIR HFA) 108 (90 BASE) MCG/ACT inhaler Inhale 2 puffs into the lungs every 6 (six) hours as needed for wheezing. Reported on 02/26/2016  . Fluticasone-Salmeterol 232-14 MCG/ACT AEPB Inhale 1 puff into the lungs daily.  . [DISCONTINUED] amoxicillin-clavulanate (AUGMENTIN) 875-125 MG tablet Take 1 tablet by mouth 2 (two) times daily. (Patient not taking: Reported on 02/26/2016)  . [DISCONTINUED] Respiratory Therapy Supplies (FLUTTER) DEVI Use as directed (Patient not taking: Reported on 02/26/2016)   No facility-administered encounter medications on file as of 02/26/2016.

## 2016-02-26 NOTE — Assessment & Plan Note (Signed)
He has been experiencing more frequent exacerbations and increasing symptoms. To some degree this is in keeping with the natural history of bronchiectasis but I think if he would be more purposeful about mucociliary clearance measures and not just relying on his cough he would see improve symptoms.  Further, he has been noncompliant with his medications and so I think he would see some improvement in symptoms if he were to take his medications regularly and exercise.  Plan: Sample mucus for fungal, bacterial, AFB organisms Change Advair to the generic form I advised him to use Spiriva on a daily basis not as needed Exercise more regularly Ambulatory oximetry testing in the office today  Greater than 25 minutes spent in direct contact with the patient today  Follow-up 3 months

## 2016-03-04 ENCOUNTER — Other Ambulatory Visit: Payer: Self-pay | Admitting: Internal Medicine

## 2016-03-04 DIAGNOSIS — J471 Bronchiectasis with (acute) exacerbation: Secondary | ICD-10-CM

## 2016-03-04 NOTE — Addendum Note (Signed)
Addended by: Baldomero LamyHAVERS, [place] Gustafson C on: 03/04/2016 03:08 PM   Modules accepted: Orders

## 2016-03-07 LAB — RESPIRATORY CULTURE OR RESPIRATORY AND SPUTUM CULTURE: GRAM STAIN: NONE SEEN

## 2016-03-13 ENCOUNTER — Telehealth: Payer: Self-pay | Admitting: Pulmonary Disease

## 2016-03-13 NOTE — Telephone Encounter (Signed)
Called pt, states he needs a PA on generic Advair. Called [facility] pharmacy to have PA form faxed to our office.  Will hold message in triage to ensure receipt of fax.

## 2016-03-14 NOTE — Telephone Encounter (Signed)
PA has not been received. A message has been left with [facility] to fax this PA again. Will await PA.

## 2016-03-14 NOTE — Telephone Encounter (Signed)
PA was received. This has been started with Cover My Meds. Key: [id number]. Will await PA decision.

## 2016-03-19 NOTE — Telephone Encounter (Signed)
OK to switch 250/50 one bid

## 2016-03-19 NOTE — Telephone Encounter (Signed)
He needs to check to see if [facility] (80/4.5 2 puff bid) is cheaper than paying for Advair with insurance coverage.

## 2016-03-19 NOTE — Telephone Encounter (Signed)
Checked Cover My Meds. PA has been denied. No alternatives were given.

## 2016-03-19 NOTE — Telephone Encounter (Signed)
[facility], advair discus 250/50 is $47/month- this is a tier 2 drug on pt's formulary and as cheap as his inhalers will be on his plan.  Ok to switch to brand name?

## 2016-03-20 MED ORDER — FLUTICASONE-SALMETEROL 250-50 MCG/DOSE IN AEPB: 1.0000 | INHALATION_SPRAY | Freq: Two times a day (BID) | RESPIRATORY_TRACT | Status: DC

## 2016-03-20 NOTE — Telephone Encounter (Signed)
Spoke with pt, aware of med switch.  rx called in to pharmacy.  Nothing further needed.

## 2016-04-03 ENCOUNTER — Encounter: Payer: Self-pay | Admitting: Pulmonary Disease

## 2016-04-03 DIAGNOSIS — A310 Pulmonary mycobacterial infection: Secondary | ICD-10-CM | POA: Insufficient documentation

## 2016-04-04 NOTE — Progress Notes (Signed)
NP

## 2016-04-07 LAB — FUNGUS CULTURE W SMEAR

## 2016-04-07 LAB — AFB CULTURE WITH SMEAR (NOT AT ARMC)

## 2016-04-08 ENCOUNTER — Encounter: Payer: Self-pay | Admitting: Pulmonary Disease

## 2016-04-09 ENCOUNTER — Telehealth: Payer: Self-pay | Admitting: Pulmonary Disease

## 2016-04-09 ENCOUNTER — Encounter: Payer: Self-pay | Admitting: Acute Care

## 2016-04-09 ENCOUNTER — Ambulatory Visit (INDEPENDENT_AMBULATORY_CARE_PROVIDER_SITE_OTHER): Payer: Commercial Managed Care - HMO | Admitting: Acute Care

## 2016-04-09 VITALS — BP 102/60 | HR 75 | Ht 70.0 in | Wt 201.8 lb

## 2016-04-09 DIAGNOSIS — J479 Bronchiectasis, uncomplicated: Secondary | ICD-10-CM

## 2016-04-09 MED ORDER — CIPROFLOXACIN HCL 500 MG PO TABS: 500.0000 mg | ORAL_TABLET | Freq: Two times a day (BID) | ORAL | Status: DC

## 2016-04-09 MED ORDER — FLUTICASONE-SALMETEROL 250-50 MCG/DOSE IN AEPB: 1.0000 | INHALATION_SPRAY | Freq: Two times a day (BID) | RESPIRATORY_TRACT | Status: DC

## 2016-04-09 NOTE — Telephone Encounter (Signed)
lmomtcb x 1 for the pt 

## 2016-04-09 NOTE — Progress Notes (Signed)
Needs repeat sputum AFB culture, would hold cipro unless he is worse than baseline. Will contact triage to help coordinate this.

## 2016-04-09 NOTE — Progress Notes (Signed)
History of Present Illness Spencer Webster is a 82 y.o. male with Bronchiectasis    04/09/2016 Follow Up Sputum Cultures Follow up appointment with results of induced sputum culture collected 03/04/2016.Marland KitchenSputum Cultures are positive for Moraxella Catarrhalis and MAC . I explained that the MAC is a chronic lung infection, and treatment includes extended antibiotic therapy. He states his breathing is at baseline. He states he has good days and less good days.He continues to cough up secretions that range from yellow to green.He states he continues to use postural drainage for mucociliary clearance, but is not using a flutter valve. He states he is more compliant with his generic Advair and Spiriva. He and his wife are very active, and travel between [place] and [place] throughout the year. We discussed treatment of the Moraxella Catarrhalis with Cipro now. We also discussed the triple antibiotic long term therapy used for MAC. I explained that decisions to initiate that therapy are often based on how you are doing clinically. Currently he denies   fever, change in sputum, chest pain, orthopnea or hemoptysis.  We will treat the  Moraxella Catarrhalis now and schedule a follow up appointment with Dr. Kendrick Fries to discuss options for MAC treatment.  Tests Synopsis : Bronchiectasis due to pneumonia as a child; first the  pulmonary in 2014. October 2014 simple spirometry consistent with moderate airflow obstruction, ratio 67%, FEV1 2.13 L (68% predicted) 07/2013 sputum culture > pan sensitive PSA, also strep pneumo 06/17: Sputum Culture> Moderate Moraxella Catarrhalis ( Beta Lactamase Positive)                                          DNA Probe Positive  For MAC  Past medical hx Past Medical History  Diagnosis Date  . Bronchiectasis (HCC)   . Osteoarthritis, multiple sites   . Pneumothorax on left 1995  . BPH (benign prostatic hypertrophy)      Past surgical hx, Family hx, Social hx all  reviewed.  Current Outpatient Prescriptions on File Prior to Visit  Medication Sig  . albuterol (PROAIR HFA) 108 (90 BASE) MCG/ACT inhaler Inhale 2 puffs into the lungs every 6 (six) hours as needed for wheezing. Reported on 02/26/2016  . Cholecalciferol (VITAMIN D PO) Take 1 tablet by mouth daily.  . Fluticasone-Salmeterol (ADVAIR DISKUS) 250-50 MCG/DOSE AEPB Inhale 1 puff into the lungs 2 (two) times daily.  . Fluticasone-Salmeterol 232-14 MCG/ACT AEPB Inhale 1 puff into the lungs daily.  . Glucosamine HCl (GLUCOSAMINE PO) Take 1 tablet by mouth daily.  . Multiple Vitamin (MULTIVITAMIN) capsule Take 1 capsule by mouth daily.  . multivitamin-lutein (OCUVITE-LUTEIN) CAPS capsule Take 1 capsule by mouth daily.  . polyethylene glycol (MIRALAX / GLYCOLAX) packet Take 17 g by mouth daily as needed.   . tiotropium (SPIRIVA) 18 MCG inhalation capsule Place 1 capsule (18 mcg total) into inhaler and inhale daily. (Patient taking differently: Place 18 mcg into inhaler and inhale daily as needed. )   No current facility-administered medications on file prior to visit.     Allergies  Allergen Reactions  . Morphine And Related     Review Of Systems:  Constitutional:   No  weight loss, night sweats,  Fevers, chills, fatigue, or  lassitude.  HEENT:   No headaches,  Difficulty swallowing,  Tooth/dental problems, or  Sore throat,  No sneezing, itching, ear ache, nasal congestion, post nasal drip,   CV:  No chest pain,  Orthopnea, PND, swelling in lower extremities, anasarca, dizziness, palpitations, syncope.   GI  No heartburn, indigestion, abdominal pain, nausea, vomiting, diarrhea, change in bowel habits, loss of appetite, bloody stools.   Resp: + shortness of breath with exertion not  at rest.  + excess mucus, + productive cough,  No non-productive cough,  No coughing up of blood.  No change in color of mucus.  No wheezing.  No chest wall deformity  Skin: no rash or lesions.  GU:  no dysuria, change in color of urine, no urgency or frequency.  No flank pain, no hematuria   MS:  No joint pain or swelling.  No decreased range of motion.  No back pain.  Psych:  No change in mood or affect. No depression or anxiety.  No memory loss.   Vital Signs BP 102/60 mmHg  Pulse 75  Ht 5\' 10"  (1.778 m)  Wt 201 lb 12.8 oz (91.536 kg)  BMI 28.96 kg/m2  SpO2 97%   Physical Exam:  General- No distress,  A&Ox3, charming and pleasant ENT: No sinus tenderness, TM clear, pale nasal mucosa, no oral exudate,no post nasal drip, no LAN Cardiac: S1, S2, regular rate and rhythm, no murmur Chest: No wheeze/ rales/ dullness; no accessory muscle use, no nasal flaring, no sternal retractions Abd.: Soft Non-tender Ext: No clubbing cyanosis, edema Neuro:  normal strength Skin: No rashes, warm and dry Psych: normal mood and behavior   Assessment/Plan  Bronchiectasis (HCC) Baseline Bronchiectasis with recent sputum cultures positive for Moderate Moraxella Catarrhalis ( Beta Lactamase Positive) DNA Probe Positive  For MAC  Plan:                                       We will treat you for the positive sputum culture. Cipro 500 mg twice daily by mouth for 10 days Continue taking your probiotic daily while on anttibiotic Follow up in 1 month  with me . Follow up appointment with Dr. Kendrick FriesMcQuaid to discuss triple antibiotic treatment for MAC Please contact office for sooner follow up if symptoms do not improve or worsen or seek emergency care       Bevelyn NgoSarah F Maleeya Peterkin, NP 04/09/2016  2:56 PM

## 2016-04-09 NOTE — Patient Instructions (Addendum)
It is nice to meet you today. We will treat you for the positive sputum culture. Cipro 500 mg twice daily by mouth for 10 days Continue taking your probiotic daily while on anttibiotic Follow up in 1 month  with me . Follow up appointment with Dr. Kendrick FriesMcQuaid to discuss triple antibiotic treatment for MAC Please contact office for sooner follow up if symptoms do not improve or worsen or seek emergency care

## 2016-04-09 NOTE — Telephone Encounter (Signed)
Hello.  Please contact the patient to let him know that I need him to provide another sputum culture for AFB.  Please let him know that if he is not feeling ill he should not take the ciprofloxacin.

## 2016-04-09 NOTE — Assessment & Plan Note (Signed)
Baseline Bronchiectasis with recent sputum cultures positive for Moderate Moraxella Catarrhalis ( Beta Lactamase Positive) DNA Probe Positive  For MAC  Plan:                                       We will treat you for the positive sputum culture. Cipro 500 mg twice daily by mouth for 10 days Continue taking your probiotic daily while on anttibiotic Follow up in 1 month  with me . Follow up appointment with Dr. Kendrick FriesMcQuaid to discuss triple antibiotic treatment for MAC Please contact office for sooner follow up if symptoms do not improve or worsen or seek emergency care

## 2016-04-10 NOTE — Telephone Encounter (Signed)
atc pt, line rang several times, no answer, line went dead.  Wcb.

## 2016-04-11 NOTE — Telephone Encounter (Signed)
LMTCB and will hold in triage since msg is closed

## 2016-04-14 NOTE — Telephone Encounter (Signed)
Spoke with pt.  He is currently taking Cipro.  He will come by lab to pick up cup for Sputum specimen.  Order placed.

## 2016-04-14 NOTE — Addendum Note (Signed)
Addended by: Abigail MiyamotoPHELPS, DENISE D on: 04/14/2016 09:14 AM   Modules accepted: Orders

## 2016-04-18 ENCOUNTER — Other Ambulatory Visit: Payer: Commercial Managed Care - HMO

## 2016-04-18 DIAGNOSIS — J479 Bronchiectasis, uncomplicated: Secondary | ICD-10-CM

## 2016-04-22 ENCOUNTER — Telehealth: Payer: Self-pay | Admitting: Pulmonary Disease

## 2016-04-22 NOTE — Telephone Encounter (Signed)
Pt is scheduled to see Spencer Webster 05/14/16 and Dr. Kendrick Fries 05/27/16.

## 2016-04-22 NOTE — Telephone Encounter (Signed)
Please ensure he has a follow up appointment with me in the next month.

## 2016-04-22 NOTE — Telephone Encounter (Signed)
I think it makes the most sense to just see me on 8/29, but see what he wants to do.  It will take that long for the culture results to be final which are necessary for planning the next steps.

## 2016-04-22 NOTE — Telephone Encounter (Signed)
atc X2, line rang to fast busy signal. Wcb.  

## 2016-04-22 NOTE — Telephone Encounter (Signed)
Acid Fast Bacilli (AFB) Culture P   Comments: MYCOBACTERIA, CULTURE, WITH FLUOROCHROME SMEAR     MICRO NUMBER:   [id number]   TEST STATUS:    PRELIMINARY   SPECIMEN SOURCE:  SPUTUM   SPECIMEN QUALITY: ADEQUATE   SMEAR:       Rare (1 +) acid-fast bacilli seen using the            fluorochrome method.   RESULT:      Culture results to follow. Final reports of            negative cultures can be expected in approximately            six weeks. Positive cultures are reported            immediately.

## 2016-04-28 ENCOUNTER — Telehealth: Payer: Self-pay | Admitting: Pulmonary Disease

## 2016-04-28 ENCOUNTER — Ambulatory Visit (INDEPENDENT_AMBULATORY_CARE_PROVIDER_SITE_OTHER): Payer: Commercial Managed Care - HMO | Admitting: Primary Care

## 2016-04-28 ENCOUNTER — Encounter: Payer: Self-pay | Admitting: Primary Care

## 2016-04-28 VITALS — BP 120/64 | HR 78 | Temp 97.4°F | Ht 70.0 in | Wt 202.1 lb

## 2016-04-28 DIAGNOSIS — M791 Myalgia, unspecified site: Secondary | ICD-10-CM

## 2016-04-28 NOTE — Progress Notes (Signed)
Subjective:    Patient ID: Spencer Webster, male    DOB: 05-25-1928, 82 y.o.   MRN: [id number]  HPI  Spencer Webster is an 82 year old male who presents today with a chief complaint myalgias. His myalgias are located to his bilateral shoulders and hips. He is concerned he may have Lyme disease as his wife was recently diagnosed and treated for Lyme Disease. He's been experiencing these symptoms for the past 2 months. He is not managed on statin therapy.   Denies rashes, nausea, vomiting, daily headaches, fevers. He was placed on Ciprofloxacin 10+ days ago for a pulmonary infection for which he's since completed.   Review of Systems  Constitutional: Positive for fatigue. Negative for chills, fever and unexpected weight change.  Respiratory: Negative for cough.   Musculoskeletal: Positive for myalgias. Negative for joint swelling.  Neurological: Negative for dizziness and headaches.       Past Medical History:  Diagnosis Date  . BPH (benign prostatic hypertrophy)   . Bronchiectasis (HCC)   . Osteoarthritis, multiple sites   . Pneumothorax on left 1995     Social History   Social History  . Marital status: Married    Spouse name: N/A  . Number of children: 0  . Years of education: N/A   Occupational History  . Sales of electronics     Retired 2013   Social History Main Topics  . Smoking status: Former Smoker    Years: 10.00    Types: Cigarettes    Quit date: 09/30/1967  . Smokeless tobacco: Never Used  . Alcohol use No  . Drug use: No  . Sexual activity: Not on file   Other Topics Concern  . Not on file   Social History Narrative   No living will   Requests wife to make health care decisions   Would accept resuscitation   Would not want tube feeds if cognitively unaware    Past Surgical History:  Procedure Laterality Date  . CATARACT EXTRACTION W/ INTRAOCULAR LENS  IMPLANT, BILATERAL    . MENISCUS REPAIR Left 1968  . TONSILLECTOMY    . TOTAL HIP ARTHROPLASTY  1999 &  2008  . UMBILICAL HERNIA REPAIR      Family History  Problem Relation Age of Onset  . Cancer Father   . Heart disease Neg Hx   . Diabetes Neg Hx   . Hypertension Neg Hx     Allergies  Allergen Reactions  . Morphine And Related     Current Outpatient Prescriptions on File Prior to Visit  Medication Sig Dispense Refill  . albuterol (PROAIR HFA) 108 (90 BASE) MCG/ACT inhaler Inhale 2 puffs into the lungs every 6 (six) hours as needed for wheezing. Reported on 02/26/2016    . Cholecalciferol (VITAMIN D PO) Take 1 tablet by mouth daily.    . Fluticasone-Salmeterol (ADVAIR) 250-50 MCG/DOSE AEPB Inhale 1 puff into the lungs every 12 (twelve) hours. 14 each 0  . Fluticasone-Salmeterol 232-14 MCG/ACT AEPB Inhale 1 puff into the lungs daily. 1 each 11  . Glucosamine HCl (GLUCOSAMINE PO) Take 1 tablet by mouth daily.    . Multiple Vitamin (MULTIVITAMIN) capsule Take 1 capsule by mouth daily.    . multivitamin-lutein (OCUVITE-LUTEIN) CAPS capsule Take 1 capsule by mouth daily.    . polyethylene glycol (MIRALAX / GLYCOLAX) packet Take 17 g by mouth daily as needed.     . tiotropium (SPIRIVA) 18 MCG inhalation capsule Place 1 capsule (18 mcg total) into  inhaler and inhale daily. (Patient taking differently: Place 18 mcg into inhaler and inhale daily as needed. ) 90 capsule 3   No current facility-administered medications on file prior to visit.     BP 120/64 (BP Location: Right Arm, Patient Position: Sitting, Cuff Size: Normal)   Pulse 78   Temp 97.4 F (36.3 C) (Oral)   Ht  (1.778 m)   Wt 202 lb 1.9 oz (91.7 kg)   SpO2 96%   BMI 29.00 kg/m    Objective:   Physical Exam  Constitutional: He appears well-nourished. He does not appear ill.  HENT:  Right Ear: Tympanic membrane and ear canal normal.  Left Ear: Tympanic membrane and ear canal normal.  Nose: No mucosal edema. Right sinus exhibits no maxillary sinus tenderness and no frontal sinus tenderness. Left sinus exhibits no  maxillary sinus tenderness and no frontal sinus tenderness.  Mouth/Throat: Oropharynx is clear and moist.  Eyes: Conjunctivae and EOM are normal. Pupils are equal, round, and reactive to light.  Neck: Neck supple.  Cardiovascular: Normal rate and regular rhythm.   Pulmonary/Chest: Effort normal and breath sounds normal.  Minor wheezing and rales throughout.  Neurological: No cranial nerve deficit.  Skin: Skin is warm and dry. No rash noted.          Assessment & Plan:  Myalgias:  Patient worried he may have Lyme disease as his wife was diagnosed and treated for this recently. He has experienced myalgias/arthralgias to shoulders and hips for the past 2 months. Denies fevers, rashes, headaches, nausea/vomiting.  Exam unremarkable as he does not appear acutely ill. Lung sounds seem to be baseline for patient. Recently treated with Cipro course several weeks ago and is feeling improved from that. Thyroid function normal in October 2016. Given that his wife did not find a tick on her body, will check him today for Lyme disease to rule out. If negative then suspect this could be residual from his recent pulmonary infection. Will await labs.  Morrie Sheldon, NP

## 2016-04-28 NOTE — Patient Instructions (Signed)
Complete lab work prior to leaving today. I will notify you of your results once received.   It was a pleasure meeting you!  

## 2016-04-28 NOTE — Progress Notes (Signed)
Pre visit review using our clinic review tool, if applicable. No additional management support is needed unless otherwise documented below in the visit note. 

## 2016-04-28 NOTE — Telephone Encounter (Signed)
Received call report: Sputum cx pos for isolated AFB  Will forward to Dr. Kendrick Fries to make him aware

## 2016-04-28 NOTE — Telephone Encounter (Signed)
noted 

## 2016-04-29 LAB — LYME AB/WESTERN BLOT REFLEX

## 2016-05-02 ENCOUNTER — Telehealth: Payer: Self-pay | Admitting: Pulmonary Disease

## 2016-05-02 NOTE — Telephone Encounter (Signed)
[facility] calling about the sputum culture that was collected on 04/18/16.  They will fax these results to BQ.  Sputum culture was microbacterium avuim intracellulare complex positive.  Will forward to BQ to make him aware.

## 2016-05-02 NOTE — Telephone Encounter (Signed)
noted 

## 2016-05-14 ENCOUNTER — Ambulatory Visit (INDEPENDENT_AMBULATORY_CARE_PROVIDER_SITE_OTHER): Payer: Commercial Managed Care - HMO | Admitting: Acute Care

## 2016-05-14 ENCOUNTER — Encounter: Payer: Self-pay | Admitting: Acute Care

## 2016-05-14 DIAGNOSIS — J479 Bronchiectasis, uncomplicated: Secondary | ICD-10-CM

## 2016-05-14 MED ORDER — TIOTROPIUM BROMIDE MONOHYDRATE 18 MCG IN CAPS: 18.0000 ug | ORAL_CAPSULE | Freq: Every day | RESPIRATORY_TRACT | 3 refills | Status: AC

## 2016-05-14 NOTE — Assessment & Plan Note (Signed)
Bronchiectasis Flare resolved after treatment with Cipro Pending Final Sputum Culture with preliminary results positive for MAC Plan: Continue using your Advair and Spiriva daily. Rinse mouth after use. We will send in a prescription for your Spiriva today. Follow up as scheduled with Dr. Trish MageMc Quaid on  05/27/2016 to further discuss sputum  culture results   ( hoping final result will be available at this time) and possible treatment options  . ( MAC) Continue taking non-sedating anti-histamine for sinus drainage.( Zyrtec/ Claritin) Generic  Loratadine is fine to use.. Use your flonase for nasal stuffiness as needed. ( 2 puffs in each nare once daily.) Please contact office for sooner follow up if symptoms do not improve or worsen or seek emergency care

## 2016-05-14 NOTE — Patient Instructions (Addendum)
It is good to see you again. I am glad you are feeling better. Continue using your Advair and Spiriva daily. Rinse mouth after use. We will send in a prescription for your Spiriva today. Follow up as scheduled with Dr. Trish MageMc Quaid on  05/27/2016 to further discuss culture results and treatment options  . Continue taking non-sedating anti-histamine for sinus drainage.( Zyrtec/ Claritin) Generic  Loratadine is fine to use.. Use your flonase for nasal stuffiness as needed. ( 2 puffs in each nare once daily.) Please contact office for sooner follow up if symptoms do not improve or worsen or seek emergency care

## 2016-05-14 NOTE — Progress Notes (Signed)
History of Present Illness Spencer Webster is a 82 y.o. male with bronchiectasis   Synopsis : Bronchiectasis due to pneumonia as a child; first the [facility] in 2014.  05/14/2016 Follow up Visit: Pt. States he is doing better. He states that his cough is better. He states that he does have cough at night, and that he noticed that his sputum is increased with dampness/ increased humodity. He states his sputum is whitish to rarely light yellow. [place].He is afebrile, no chest pain, no orthopnea or hemoptysis.He does state that he has had some generalized body aches that he is planning to follow up with his PCP about. We discussed his sputum culture and that we will wait for final results, and then discuss possible plan of care with Dr. Kendrick FriesMcQuaid on 05/27/2016. I explained that one option is a triple antibiotic therapy. He stated that he has had  bronchiectasis for a long time, and he is the best judge of when it is bad.He feels it is fairly well controlled at present. He feels the last treatment with Cipro helped resolve his flare.He has an appointment to discuss treatment options with Dr.McQuaid on 05/27/2016, at which time final culture results should be available. ( I did have Dr,. Ramaswamy search through the microbiology section of Epic with me to see if we could see a final culture result vs DNA probe results  which we know were  positive. We did not see a final culture result).   Tests: CXR:09/03/2016  Stable densities noted in the lung bases, likely scarring. Underlying COPD. No confluent airspace opacities. No effusions. Heart is normal size. Aorta is calcified. No acute bony abnormality. IMPRESSION: COPD.  Stable bibasilar scarring.  No active disease.    October 2014 simple spirometry consistent with moderate airflow obstruction, ratio 67%, FEV1 2.13 L (68% predicted) 07/2013 sputum culture > pan sensitive PSA, also  strep pneumo 06/17: Sputum Culture> Moderate Moraxella Catarrhalis ( Beta Lactamase Positive)                                          DNA Probe Positive  For MAC   Past medical hx Past Medical History:  Diagnosis Date  . BPH (benign prostatic hypertrophy)   . Bronchiectasis (HCC)   . Osteoarthritis, multiple sites   . Pneumothorax on left 1995     Past surgical hx, Family hx, Social hx all reviewed.  Current Outpatient Prescriptions on File Prior to Visit  Medication Sig  . albuterol (PROAIR HFA) 108 (90 BASE) MCG/ACT inhaler Inhale 2 puffs into the lungs every 6 (six) hours as needed for wheezing. Reported on 02/26/2016  . Cholecalciferol (VITAMIN D PO) Take 1 tablet by mouth daily.  . Fluticasone-Salmeterol (ADVAIR) 250-50 MCG/DOSE AEPB Inhale 1 puff into the lungs every 12 (twelve) hours.  . Glucosamine HCl (GLUCOSAMINE PO) Take 1 tablet by mouth daily.  . Multiple Vitamin (MULTIVITAMIN) capsule Take 1 capsule by mouth daily.  . multivitamin-lutein (OCUVITE-LUTEIN) CAPS capsule Take 1 capsule by mouth daily.  . polyethylene glycol (MIRALAX / GLYCOLAX) packet Take 17 g by mouth daily as needed.    No current facility-administered medications on file prior to visit.      Allergies  Allergen Reactions  . Morphine And Related     Review Of Systems:  Constitutional:  No  weight loss, night sweats,  Fevers, chills, fatigue, or  lassitude.  HEENT:   No headaches,  Difficulty swallowing,  Tooth/dental problems, or  Sore throat,                No sneezing, itching, ear ache, nasal congestion, post nasal drip, improved rare cough  CV:  No chest pain,  Orthopnea, PND, swelling in lower extremities, anasarca, dizziness, palpitations, syncope.   GI  No heartburn, indigestion, abdominal pain, nausea, vomiting, diarrhea, change in bowel habits, loss of appetite, bloody stools.   Resp: No shortness of breath with exertion or at rest.  + excess mucus, + productive cough,  No  non-productive cough,  No coughing up of blood.  No change in color of mucus.  No wheezing.  No chest wall deformity  Skin: no rash or lesions.  GU: no dysuria, change in color of urine, no urgency or frequency.  No flank pain, no hematuria   MS:  No joint pain or swelling.  No decreased range of motion.  No back pain.  Psych:  No change in mood or affect. No depression or anxiety.  No memory loss.   Vital Signs BP 110/72 (BP Location: Right Arm, Cuff Size: Normal)   Pulse 86   Ht 5\' 10"  (1.778 m)   Wt 203 lb (92.1 kg)   SpO2 93%   BMI 29.13 kg/m    Physical Exam:  General- No distress,  A&Ox3, pleasant ENT: No sinus tenderness, TM clear, pale nasal mucosa, no oral exudate,no post nasal drip, no LAN Cardiac: S1, S2, regular rate and rhythm, no murmur Chest: No wheeze/ rales, + rhonchi which clear with cough/ no dullness; no accessory muscle use, no nasal flaring, no sternal retractions Abd.: Soft Non-tender Ext: No clubbing cyanosis, edema Neuro:  normal strength Skin: No rashes, warm and dry Psych: normal mood and behavior   Assessment/Plan  Bronchiectasis (HCC) Bronchiectasis Flare resolved after treatment with Cipro Pending Final Sputum Culture with preliminary results positive for MAC Plan: Continue using your Advair and Spiriva daily. Rinse mouth after use. We will send in a prescription for your Spiriva today. Follow up as scheduled with Dr. Trish MageMc Quaid on  05/27/2016 to further discuss sputum  culture results   ( hoping final result will be available at this time) and possible treatment options  . ( MAC) Continue taking non-sedating anti-histamine for sinus drainage.( Zyrtec/ Claritin) Generic  Loratadine is fine to use.. Use your flonase for nasal stuffiness as needed. ( 2 puffs in each nare once daily.) Please contact office for sooner follow up if symptoms do not improve or worsen or seek emergency care      Bevelyn NgoSarah F Groce, NP 05/14/2016  1:01  PM

## 2016-05-15 ENCOUNTER — Encounter: Payer: Self-pay | Admitting: Internal Medicine

## 2016-05-15 ENCOUNTER — Ambulatory Visit (INDEPENDENT_AMBULATORY_CARE_PROVIDER_SITE_OTHER): Payer: Commercial Managed Care - HMO | Admitting: Internal Medicine

## 2016-05-15 DIAGNOSIS — M791 Myalgia, unspecified site: Secondary | ICD-10-CM

## 2016-05-15 DIAGNOSIS — M353 Polymyalgia rheumatica: Secondary | ICD-10-CM | POA: Insufficient documentation

## 2016-05-15 NOTE — Progress Notes (Signed)
   Subjective:    Patient ID: Spencer Webster, male    DOB: 03/05/1928, 82 y.o.   MRN: [id number]  HPI Here with wife Having pains  Pain in hands at times--with drip Has especially bad pain in shoulders---right is much worse. So bad in bed that he has to get up  Some across the back Also notes pain in hips--and has had THR Needs to push up with arms to get up from sitting  No fever No joint swelling Aleve helps some  Current Outpatient Prescriptions on File Prior to Visit  Medication Sig Dispense Refill  . albuterol (PROAIR HFA) 108 (90 BASE) MCG/ACT inhaler Inhale 2 puffs into the lungs every 6 (six) hours as needed for wheezing. Reported on 02/26/2016    . Cholecalciferol (VITAMIN D PO) Take 1 tablet by mouth daily.    . Fluticasone-Salmeterol (ADVAIR) 250-50 MCG/DOSE AEPB Inhale 1 puff into the lungs every 12 (twelve) hours. 14 each 0  . Glucosamine HCl (GLUCOSAMINE PO) Take 1 tablet by mouth daily.    . Multiple Vitamin (MULTIVITAMIN) capsule Take 1 capsule by mouth daily.    . multivitamin-lutein (OCUVITE-LUTEIN) CAPS capsule Take 1 capsule by mouth daily.    . polyethylene glycol (MIRALAX / GLYCOLAX) packet Take 17 g by mouth daily as needed.     . tiotropium (SPIRIVA) 18 MCG inhalation capsule Place 1 capsule (18 mcg total) into inhaler and inhale daily. 90 capsule 3   No current facility-administered medications on file prior to visit.     Allergies  Allergen Reactions  . Morphine And Related     Past Medical History:  Diagnosis Date  . BPH (benign prostatic hypertrophy)   . Bronchiectasis (HCC)   . Osteoarthritis, multiple sites   . Pneumothorax on left 1995    Past Surgical History:  Procedure Laterality Date  . CATARACT EXTRACTION W/ INTRAOCULAR LENS  IMPLANT, BILATERAL    . MENISCUS REPAIR Left 1968  . TONSILLECTOMY    . TOTAL HIP ARTHROPLASTY  1999 & 2008  . UMBILICAL HERNIA REPAIR      Family History  Problem Relation Age of Onset  . Cancer Father   .  Heart disease Neg Hx   . Diabetes Neg Hx   . Hypertension Neg Hx     Social History   Social History  . Marital status: Married    Spouse name: N/A  . Number of children: 0  . Years of education: N/A   Occupational History  . Sales of electronics     Retired 2013   Social History Main Topics  . Smoking status: Former Smoker    Years: 10.00    Types: Cigarettes    Quit date: 09/30/1967  . Smokeless tobacco: Never Used  . Alcohol use No  . Drug use: No  . Sexual activity: Not on file   Other Topics Concern  . Not on file   Social History Narrative   No living will   Requests wife to make health care decisions   Would accept resuscitation   Would not want tube feeds if cognitively unaware   Review of Systems Bruising easily lately Appetite is okay Weight fairly stable No headache No unilateral vision loss    Objective:   Physical Exam  Genitourinary:  Genitourinary Comments: Marked arthritic changes in shoulders--especially right. Limited ROM Hip ROM is good          Assessment & Plan:

## 2016-05-15 NOTE — Patient Instructions (Signed)
If the test is normal--and you don't need the prednisone--let me know if you pain worsens enough to consider treatment with the tramadol

## 2016-05-15 NOTE — Assessment & Plan Note (Signed)
Hard to tell  Certainly has advanced arthritic changes in shoulders but has had THR--so arthritic not likely there Will check sed rate--could be PMR If sed rate normal--will need to consider other alternatives

## 2016-05-16 ENCOUNTER — Other Ambulatory Visit: Payer: Self-pay | Admitting: Internal Medicine

## 2016-05-16 LAB — CBC WITH DIFFERENTIAL/PLATELET
Basophils Absolute: 0.1 10*3/uL (ref 0.0–0.1)
Basophils Relative: 0.6 % (ref 0.0–3.0)
EOS ABS: 0.3 10*3/uL (ref 0.0–0.7)
EOS PCT: 2.9 % (ref 0.0–5.0)
HCT: 41.1 % (ref 39.0–52.0)
Hemoglobin: 13.8 g/dL (ref 13.0–17.0)
LYMPHS ABS: 1.5 10*3/uL (ref 0.7–4.0)
Lymphocytes Relative: 15.6 % (ref 12.0–46.0)
MCHC: 33.5 g/dL (ref 30.0–36.0)
MCV: 86.6 fl (ref 78.0–100.0)
MONO ABS: 0.7 10*3/uL (ref 0.1–1.0)
Monocytes Relative: 7.8 % (ref 3.0–12.0)
NEUTROS PCT: 73.1 % (ref 43.0–77.0)
Neutro Abs: 6.9 10*3/uL (ref 1.4–7.7)
Platelets: 290 10*3/uL (ref 150.0–400.0)
RBC: 4.74 Mil/uL (ref 4.22–5.81)
RDW: 14.3 % (ref 11.5–15.5)
WBC: 9.4 10*3/uL (ref 4.0–10.5)

## 2016-05-16 LAB — SEDIMENTATION RATE: SED RATE: 40 mm/h — AB (ref 0–20)

## 2016-05-16 MED ORDER — PREDNISONE 20 MG PO TABS
20.0000 mg | ORAL_TABLET | Freq: Every day | ORAL | 0 refills | Status: DC
Start: 2016-05-16 — End: 2016-05-29

## 2016-05-19 LAB — AFB CULTURE WITH SMEAR (NOT AT ARMC)

## 2016-05-19 NOTE — Progress Notes (Signed)
Reviewed, I agree with this plan of care 

## 2016-05-27 ENCOUNTER — Encounter: Payer: Self-pay | Admitting: Pulmonary Disease

## 2016-05-27 ENCOUNTER — Ambulatory Visit (INDEPENDENT_AMBULATORY_CARE_PROVIDER_SITE_OTHER): Payer: Commercial Managed Care - HMO | Admitting: Pulmonary Disease

## 2016-05-27 VITALS — BP 124/66 | HR 70 | Ht 70.0 in | Wt 208.8 lb

## 2016-05-27 DIAGNOSIS — A310 Pulmonary mycobacterial infection: Secondary | ICD-10-CM | POA: Diagnosis not present

## 2016-05-27 DIAGNOSIS — J479 Bronchiectasis, uncomplicated: Secondary | ICD-10-CM | POA: Diagnosis not present

## 2016-05-27 NOTE — Assessment & Plan Note (Addendum)
With 1 exacerbation this year, his disease is likely slightly worse compared to when we last performed pulmonary function testing in 2014 given his recurrent exacerbations in the last 3-4 years and the severity of his disease and his age.  I think his dyspnea is multifactorial and that deconditioning and weight gain have contributed as well.  Plan: Repeat PFT Repeat CXR You Spiriva daily, not as needed Use Advair twice a day Exercise regularly, he wants to try to do this on his own a gym Flu shot when he finishes prednisone Slowly taper off prednisone over the next 10-12 days

## 2016-05-27 NOTE — Patient Instructions (Signed)
Starting tomorrow take 15 mg of prednisone daily, take that for 3 days, after that take 10 mg daily for 3 days, after that take 5 mg daily for 3 days then stop. We will order a lung function test and a chest x-ray and cardia of the results Exercise more frequently, I recommend water aerobics I will see you back in 3 months

## 2016-05-27 NOTE — Progress Notes (Signed)
Subjective:    Patient ID: Spencer Webster, male    DOB: 1928/07/24, 82 y.o.   MRN: [id number]  Synopsis : Bronchiectasis due to pneumonia as a child; first the [facility] in 2014. October 2014 simple spirometry consistent with moderate airflow obstruction, ratio 67%, FEV1 2.13 L (68% predicted) 07/2013 sputum culture > pan sensitive PSA, also strep pneumo June and July 2017 [employer] sputum culture> positive for MAI  HPI Chief Complaint  Patient presents with  . Follow-up    pt c/o prod cough with yellow mucus.  Also notes chronic PND.     Spencer Webster is here to see me for his cough with bronchiectasis. This year has been more of a problem for him with increasing cough, mucus production and dyspnea. He has been found to have MAI this year.   He feels that the humidity makes his breathing worse. [place] was [place] in [place] and he wonders if his breathing would be better there too.   Since his last round of Cipro he feels better.  He continues to take prednisone.  He is still coughing a lot in the evenings.  He says the chest congestion and mucus is worse. He has lost about 10-15 pounds this year but he this.  No fevers, chills.  No night sweats. He is perhaps producing more mucus this year compared to last, but not to a great degree.   He is still taking Advair, but he is back to taking his Spiriva daily.    Past Medical History:  Diagnosis Date  . BPH (benign prostatic hypertrophy)   . Bronchiectasis (HCC)   . Osteoarthritis, multiple sites   . Pneumothorax on left 1995     Review of Systems  Constitutional: Positive for fatigue. Negative for chills and fever.  HENT: Negative for postnasal drip, rhinorrhea and sinus pressure.   Respiratory: Positive for cough and shortness of breath. Negative for wheezing.   Cardiovascular: Negative for chest pain, palpitations and leg swelling.       Objective:   Physical Exam Vitals:   05/27/16 1559  BP: 124/66  BP Location: Left  Arm  Cuff Size: Normal  Pulse: 70  SpO2: 96%  Weight: 208 lb 12.8 oz (94.7 kg)  Height: 5\' 10"  (1.778 m)  RA   Gen: well appearing, no acute distress HEENT: NCAT, EOMi, OP clear PULM: Few rhonchi and wheezes bilaterally, good air movement  CV: RRR, no mgr, no JVD AB: BS+, soft, nontender, no hsm Ext: warm, no edema, SOME CLUBBING NOTED TODAY, no cyanosis Derm: no rash or skin breakdown Neuro: A&Ox4, MAEW  Notes from our nurse practitioner were reviewed where she was treated for a bronchiectasis flare  Sputum culture results reviewed     Assessment & Plan:   MAI (mycobacterium avium-intracellulare) (HCC) I believe that he is colonized with MAI but I'm not convinced that he is infected as he has not lost weight this year and it's difficult to say that his mucus production or dyspnea or any worse this year than in previous years. If you are actively infected I would've anticipated more weight loss and more recurrent rounds of antibiotic treatment. For now or going to monitor and withhold treatment.  Bronchiectasis (HCC) With 1 exacerbation this year, his disease is likely slightly worse compared to when we last performed pulmonary function testing in 2014 given his recurrent exacerbations in the last 3-4 years and the severity of his disease and his age.  I think his dyspnea is  multifactorial and that deconditioning and weight gain have contributed as well.  Plan: Repeat PFT Repeat CXR You Spiriva daily, not as needed Use Advair twice a day Exercise regularly, he wants to try to do this on his own a gym Flu shot when he finishes prednisone Slowly taper off prednisone over the next 10-12 days     Updated Medication List Outpatient Encounter Prescriptions as of 05/27/2016  Medication Sig  . albuterol (PROAIR HFA) 108 (90 BASE) MCG/ACT inhaler Inhale 2 puffs into the lungs every 6 (six) hours as needed for wheezing. Reported on 02/26/2016  . Cholecalciferol (VITAMIN D PO)  Take 1 tablet by mouth daily.  . Fluticasone-Salmeterol (ADVAIR) 250-50 MCG/DOSE AEPB Inhale 1 puff into the lungs every 12 (twelve) hours.  . Glucosamine HCl (GLUCOSAMINE PO) Take 1 tablet by mouth daily.  . Multiple Vitamin (MULTIVITAMIN) capsule Take 1 capsule by mouth daily.  . multivitamin-lutein (OCUVITE-LUTEIN) CAPS capsule Take 1 capsule by mouth daily.  . polyethylene glycol (MIRALAX / GLYCOLAX) packet Take 17 g by mouth daily as needed.   . predniSONE (DELTASONE) 20 MG tablet Take 1 tablet (20 mg total) by mouth daily.  Marland Kitchen tiotropium (SPIRIVA) 18 MCG inhalation capsule Place 1 capsule (18 mcg total) into inhaler and inhale daily.   No facility-administered encounter medications on file as of 05/27/2016.

## 2016-05-27 NOTE — Assessment & Plan Note (Signed)
I believe that he is colonized with MAI but I'm not convinced that he is infected as he has not lost weight this year and it's difficult to say that his mucus production or dyspnea or any worse this year than in previous years. If you are actively infected I would've anticipated more weight loss and more recurrent rounds of antibiotic treatment. For now or going to monitor and withhold treatment.

## 2016-05-29 ENCOUNTER — Encounter: Payer: Self-pay | Admitting: Internal Medicine

## 2016-05-29 ENCOUNTER — Ambulatory Visit (INDEPENDENT_AMBULATORY_CARE_PROVIDER_SITE_OTHER)
Admission: RE | Admit: 2016-05-29 | Discharge: 2016-05-29 | Disposition: A | Discharge status: Home / Self Care | Payer: Commercial Managed Care - HMO | Source: Ambulatory Visit | Attending: Pulmonary Disease | Admitting: Pulmonary Disease

## 2016-05-29 ENCOUNTER — Ambulatory Visit (INDEPENDENT_AMBULATORY_CARE_PROVIDER_SITE_OTHER): Payer: Commercial Managed Care - HMO | Admitting: Internal Medicine

## 2016-05-29 DIAGNOSIS — J479 Bronchiectasis, uncomplicated: Secondary | ICD-10-CM

## 2016-05-29 DIAGNOSIS — M353 Polymyalgia rheumatica: Secondary | ICD-10-CM

## 2016-05-29 MED ORDER — PREDNISONE 10 MG PO TABS: 10.0000 mg | ORAL_TABLET | Freq: Every day | ORAL | 2 refills | Status: AC

## 2016-05-29 NOTE — Progress Notes (Signed)
Pre visit review using our clinic review tool, if applicable. No additional management support is needed unless otherwise documented below in the visit note. 

## 2016-05-29 NOTE — Assessment & Plan Note (Signed)
Moderately elevated sed rate and significant response to the prednisone Discussed the regimen--with weaning over time as tolerated based on the response to the dose Counseled most of 15 minute visit--outlined the weaning schedule and to go back up to the lowest effective dose if he relapses. Goal will be to get him to every other day Rx by 3 months (and off if possible eventually)

## 2016-05-29 NOTE — Progress Notes (Signed)
   Subjective:    Patient ID: Spencer Webster, male    DOB: 1927-11-23, 82 y.o.   MRN: [id number]  HPI Here for follow up of possible PMR He feels much better [facility] [employer]  Wife notices a difference as well Much less pain  Current Outpatient Prescriptions on File Prior to Visit  Medication Sig Dispense Refill  . albuterol (PROAIR HFA) 108 (90 BASE) MCG/ACT inhaler Inhale 2 puffs into the lungs every 6 (six) hours as needed for wheezing. Reported on 02/26/2016    . Cholecalciferol (VITAMIN D PO) Take 1 tablet by mouth daily.    . Fluticasone-Salmeterol (ADVAIR) 250-50 MCG/DOSE AEPB Inhale 1 puff into the lungs every 12 (twelve) hours. 14 each 0  . Glucosamine HCl (GLUCOSAMINE PO) Take 1 tablet by mouth daily.    . Multiple Vitamin (MULTIVITAMIN) capsule Take 1 capsule by mouth daily.    . multivitamin-lutein (OCUVITE-LUTEIN) CAPS capsule Take 1 capsule by mouth daily.    . polyethylene glycol (MIRALAX / GLYCOLAX) packet Take 17 g by mouth daily as needed.     . predniSONE (DELTASONE) 20 MG tablet Take 1 tablet (20 mg total) by mouth daily. 30 tablet 0  . tiotropium (SPIRIVA) 18 MCG inhalation capsule Place 1 capsule (18 mcg total) into inhaler and inhale daily. 90 capsule 3   No current facility-administered medications on file prior to visit.     Allergies  Allergen Reactions  . Morphine And Related     Past Medical History:  Diagnosis Date  . BPH (benign prostatic hypertrophy)   . Bronchiectasis (HCC)   . Osteoarthritis, multiple sites   . Pneumothorax on left 1995    Past Surgical History:  Procedure Laterality Date  . CATARACT EXTRACTION W/ INTRAOCULAR LENS  IMPLANT, BILATERAL    . MENISCUS REPAIR Left 1968  . TONSILLECTOMY    . TOTAL HIP ARTHROPLASTY  1999 & 2008  . UMBILICAL HERNIA REPAIR      Family History  Problem Relation Age of Onset  . Cancer Father   . Heart disease Neg Hx   . Diabetes Neg Hx   . Hypertension Neg Hx      Social History   Social History  . Marital status: Married    Spouse name: N/A  . Number of children: 0  . Years of education: N/A   Occupational History  . Sales of electronics     Retired 2013   Social History Main Topics  . Smoking status: Former Smoker    Years: 10.00    Types: Cigarettes    Quit date: 09/30/1967  . Smokeless tobacco: Never Used  . Alcohol use No  . Drug use: No  . Sexual activity: Not on file   Other Topics Concern  . Not on file   Social History Narrative   No living will   Requests wife to make health care decisions   Would accept resuscitation   Would not want tube feeds if cognitively unaware   Review of Systems  Appetite remains good Sleeps well     Objective:   Physical Exam  Psychiatric: He has a normal mood and affect. His behavior is normal.          Assessment & Plan:

## 2016-05-29 NOTE — Patient Instructions (Signed)
Please continue the prednisone 20mg  daily for the next 2 weeks. Then decrease to 10mg  on odd days and 20mg  on even days. If your pain doesn't come back, try decreasing to 10mg  every day about October 15th.

## 2016-06-03 ENCOUNTER — Ambulatory Visit: Payer: Commercial Managed Care - HMO | Attending: Pulmonary Disease

## 2016-06-03 ENCOUNTER — Other Ambulatory Visit: Payer: Self-pay | Admitting: Pulmonary Disease

## 2016-06-03 DIAGNOSIS — J479 Bronchiectasis, uncomplicated: Secondary | ICD-10-CM | POA: Diagnosis not present

## 2016-06-03 DIAGNOSIS — J988 Other specified respiratory disorders: Secondary | ICD-10-CM | POA: Diagnosis not present

## 2016-06-03 DIAGNOSIS — R0602 Shortness of breath: Secondary | ICD-10-CM

## 2016-06-03 MED ORDER — ALBUTEROL SULFATE (2.5 MG/3ML) 0.083% IN NEBU
2.5000 mg | INHALATION_SOLUTION | Freq: Once | RESPIRATORY_TRACT | Status: AC
  Administered 2016-06-03: 2.5 mg via RESPIRATORY_TRACT
  Filled 2016-06-03: qty 3

## 2016-06-04 ENCOUNTER — Other Ambulatory Visit: Payer: Self-pay | Admitting: Internal Medicine

## 2016-07-07 ENCOUNTER — Encounter: Payer: Commercial Managed Care - HMO | Admitting: Internal Medicine

## 2016-07-09 ENCOUNTER — Encounter: Payer: Self-pay | Admitting: Internal Medicine

## 2016-07-09 ENCOUNTER — Ambulatory Visit (INDEPENDENT_AMBULATORY_CARE_PROVIDER_SITE_OTHER): Payer: Commercial Managed Care - HMO | Admitting: Internal Medicine

## 2016-07-09 VITALS — BP 128/84 | HR 75 | Temp 98.0°F | Ht 70.0 in | Wt 204.0 lb

## 2016-07-09 DIAGNOSIS — M353 Polymyalgia rheumatica: Secondary | ICD-10-CM

## 2016-07-09 DIAGNOSIS — J479 Bronchiectasis, uncomplicated: Secondary | ICD-10-CM

## 2016-07-09 DIAGNOSIS — R600 Localized edema: Secondary | ICD-10-CM

## 2016-07-09 DIAGNOSIS — Z0000 Encounter for general adult medical examination without abnormal findings: Secondary | ICD-10-CM | POA: Diagnosis not present

## 2016-07-09 DIAGNOSIS — Z7189 Other specified counseling: Secondary | ICD-10-CM

## 2016-07-09 DIAGNOSIS — Z23 Encounter for immunization: Secondary | ICD-10-CM | POA: Diagnosis not present

## 2016-07-09 LAB — COMPREHENSIVE METABOLIC PANEL
ALT: 16 U/L (ref 0–53)
AST: 15 U/L (ref 0–37)
Albumin: 3.6 g/dL (ref 3.5–5.2)
Alkaline Phosphatase: 64 U/L (ref 39–117)
BILIRUBIN TOTAL: 0.5 mg/dL (ref 0.2–1.2)
BUN: 22 mg/dL (ref 6–23)
CO2: 26 meq/L (ref 19–32)
Calcium: 9.1 mg/dL (ref 8.4–10.5)
Chloride: 102 mEq/L (ref 96–112)
Creatinine, Ser: 1.14 mg/dL (ref 0.40–1.50)
GFR: 64.63 mL/min (ref >60.00)
GLUCOSE: 107 mg/dL — AB (ref 70–99)
POTASSIUM: 4.2 meq/L (ref 3.5–5.1)
SODIUM: 137 meq/L (ref 135–145)
Total Protein: 6.8 g/dL (ref 6.0–8.3)

## 2016-07-09 LAB — SEDIMENTATION RATE: SED RATE: 25 mm/h — AB (ref 0–20)

## 2016-07-09 NOTE — Assessment & Plan Note (Signed)
Will start prednisone wean Recheck sed rate

## 2016-07-09 NOTE — Patient Instructions (Signed)
Please try cutting the prednisone in half (some of them). Take 2 pills a day on odd days and take 1.5 pills (15mg ) on even days. If you are doing well, without an increase in the soreness, after a month, cut down to 1.5 pills every day.

## 2016-07-09 NOTE — Addendum Note (Signed)
Addended by: Eual FinesBRIDGES, Floetta Brickey P on: 07/09/2016 04:36 PM   Modules accepted: Orders

## 2016-07-09 NOTE — Assessment & Plan Note (Signed)
See social history Blank forms given 

## 2016-07-09 NOTE — Assessment & Plan Note (Signed)
I have personally reviewed the Medicare Annual Wellness questionnaire and have noted 1. The patient's medical and social history 2. Their use of alcohol, tobacco or illicit drugs 3. Their current medications and supplements 4. The patient's functional ability including ADL's, fall risks, home safety risks and hearing or visual             impairment. 5. Diet and physical activities 6. Evidence for depression or mood disorders  The patients weight, height, BMI and visual acuity have been recorded in the chart I have made referrals, counseling and provided education to the patient based review of the above and I have provided the pt with a written personalized care plan for preventive services.  I have provided you with a copy of your personalized plan for preventive services. Please take the time to review along with your updated medication list.  Will give flu vaccine today No cancer screening due to age Working on exercise

## 2016-07-09 NOTE — Progress Notes (Signed)
Subjective:    Patient ID: Spencer Webster, male    DOB: [birth year], 82 y.o.   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 ADVAIR DISKUS 250-50 MCG/DOSE AEPB INHALE ONE DOSE BY MOUTH TWICE DAILY 120 each 5  . albuterol (PROAIR HFA) 108 (90 BASE) MCG/ACT inhaler Inhale 2 puffs into the lungs every 6 (six) hours as needed for wheezing. Reported on 02/26/2016    . Cholecalciferol (VITAMIN D PO) Take 1 tablet by mouth daily.    . Fluticasone-Salmeterol (ADVAIR) 250-50 MCG/DOSE AEPB Inhale 1 puff into the lungs every 12 (twelve) hours. 14 each 0  . Glucosamine HCl (GLUCOSAMINE PO) Take 1 tablet by mouth daily.    . Multiple Vitamin (MULTIVITAMIN) capsule Take 1 capsule by mouth daily.    . multivitamin-lutein (OCUVITE-LUTEIN) CAPS capsule Take 1 capsule by mouth daily.    . polyethylene glycol (MIRALAX / GLYCOLAX) packet Take 17 g by mouth daily as needed.     . predniSONE (DELTASONE) 10 MG tablet  Take 1-2 tablets (10-20 mg total) by mouth daily with breakfast. 100 tablet 2  . tiotropium (SPIRIVA) 18 MCG inhalation capsule Place 1 capsule (18 mcg total) into inhaler and inhale daily. 90 capsule 3   No current facility-administered medications on file prior to visit.     Allergies  Allergen Reactions  . Morphine And Related     Past Medical History:  Diagnosis Date  . BPH (benign prostatic hypertrophy)   . Bronchiectasis (HCC)   . Osteoarthritis, multiple sites   . Pneumothorax on left 1995    Past Surgical History:  Procedure Laterality Date  . CATARACT EXTRACTION W/ INTRAOCULAR LENS  IMPLANT, BILATERAL    . MENISCUS REPAIR Left 1968  . TONSILLECTOMY    . TOTAL HIP ARTHROPLASTY  1999 & 2008  . UMBILICAL HERNIA REPAIR      Family History  Problem Relation Age of Onset  . Cancer Father   . Heart disease Neg Hx   . Diabetes Neg Hx   . Hypertension Neg Hx     Social History   Social History  . Marital status: Married    Spouse name: N/A  . Number of children: 0  . Years of education: N/A   Occupational History  . Sales of electronics     Retired 2013   Social History Main Topics  . Smoking status: Former Smoker    Years: 10.00    Types: Cigarettes    Quit date: 09/30/1967  .  Smokeless tobacco: Never Used  . Alcohol use No  . Drug use: No  . Sexual activity: Not on file   Other Topics Concern  . Not on file   Social History Narrative   No living will   Requests wife to make health care decisions   Would accept resuscitation--  But no prolonged life support   Would not want tube feeds if cognitively unaware   Review of Systems Appetite is good Weight is stable Sleeps fairly well Bowels are slow. 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 No meds for this Voids okay. Nocturia x 1 at most    Objective:   Physical Exam  Constitutional: He is oriented to person, place, and  time. He appears well-developed and well-nourished. No distress.  HENT:  Mouth/Throat: Oropharynx is clear and moist. No oropharyngeal exudate.  Neck: Normal range of motion. Neck supple. No thyromegaly present.  Cardiovascular: Normal rate, regular rhythm, normal heart sounds and intact distal pulses.  Exam reveals no gallop.   No murmur heard. Pulmonary/Chest: Effort normal. No respiratory distress. He has no wheezes. He has no rales.  Slightly bronchial sounds but clear Dyspnea with putting on shoes, getting on table, etc  Abdominal: Soft. There is no tenderness.  Musculoskeletal: He exhibits no tenderness.  1+ edema only in ankles Mild varicosities--slightly tender one in right calf  Lymphadenopathy:    He has no cervical adenopathy.  Neurological: He is alert and oriented to person, place, and time.  President--- "Garnet Koyanagi, Obama, guy from [facility] [phone]-  Not good with math D-l-o-w Recall 2/3  Skin: No rash noted. No erythema.  Psychiatric: He has a normal mood and affect. His behavior is normal.          Assessment & Plan:

## 2016-07-09 NOTE — Assessment & Plan Note (Signed)
Hopefully will improve with reducing the prednisone

## 2016-07-09 NOTE — Progress Notes (Signed)
Pre visit review using our clinic review tool, if applicable. No additional management support is needed unless otherwise documented below in the visit note. 

## 2016-07-09 NOTE — Assessment & Plan Note (Signed)
Same status Continues with pulmonary

## 2016-07-10 LAB — CBC WITH DIFFERENTIAL/PLATELET
Basophils Absolute: 0 10*3/uL (ref 0.0–0.1)
Basophils Relative: 0 % (ref 0.0–3.0)
Eosinophils Absolute: 0 10*3/uL (ref 0.0–0.7)
Eosinophils Relative: 0.3 % (ref 0.0–5.0)
HCT: 42.4 % (ref 39.0–52.0)
Hemoglobin: 14.2 g/dL (ref 13.0–17.0)
LYMPHS ABS: 0.8 10*3/uL (ref 0.7–4.0)
Lymphocytes Relative: 5.8 % — ABNORMAL LOW (ref 12.0–46.0)
MCHC: 33.5 g/dL (ref 30.0–36.0)
MCV: 87.2 fl (ref 78.0–100.0)
MONOS PCT: 6.1 % (ref 3.0–12.0)
Monocytes Absolute: 0.8 10*3/uL (ref 0.1–1.0)
NEUTROS ABS: 11.3 10*3/uL — AB (ref 1.4–7.7)
Platelets: 231 10*3/uL (ref 150.0–400.0)
RBC: 4.86 Mil/uL (ref 4.22–5.81)
RDW: 16.1 % — AB (ref 11.5–15.5)
WBC: 12.9 10*3/uL — ABNORMAL HIGH (ref 4.0–10.5)

## 2016-07-23 ENCOUNTER — Ambulatory Visit (INDEPENDENT_AMBULATORY_CARE_PROVIDER_SITE_OTHER)
Admission: RE | Admit: 2016-07-23 | Discharge: 2016-07-23 | Disposition: A | Discharge status: Home / Self Care | Payer: Commercial Managed Care - HMO | Source: Ambulatory Visit | Attending: Internal Medicine | Admitting: Internal Medicine

## 2016-07-23 ENCOUNTER — Ambulatory Visit (INDEPENDENT_AMBULATORY_CARE_PROVIDER_SITE_OTHER): Payer: Commercial Managed Care - HMO | Admitting: Internal Medicine

## 2016-07-23 ENCOUNTER — Encounter: Payer: Self-pay | Admitting: Internal Medicine

## 2016-07-23 VITALS — BP 106/70 | HR 105 | Temp 97.3°F | Wt 201.0 lb

## 2016-07-23 DIAGNOSIS — R109 Unspecified abdominal pain: Secondary | ICD-10-CM | POA: Diagnosis not present

## 2016-07-23 DIAGNOSIS — J471 Bronchiectasis with (acute) exacerbation: Secondary | ICD-10-CM

## 2016-07-23 NOTE — Assessment & Plan Note (Signed)
Not clear if this was GI illness or respiratory No signs of abdominal catastrophe Still some concern of  Could be related to constipation--- needs to restart the miralax Discussed fluids, etc

## 2016-07-23 NOTE — Progress Notes (Signed)
Pre visit review using our clinic review tool, if applicable. No additional management support is needed unless otherwise documented below in the visit note. 

## 2016-07-23 NOTE — Progress Notes (Signed)
Subjective:    Patient ID: Spencer Webster, male    DOB: [birth year], 82 y.o.   MRN: [id number]  HPI Here due to loss of appetite  Wife took visit to [place] 6 days ago He got sick in that time Didn't really get out of bed-- no meds, food. Only got up to bathroom and drank water Felt very weak Very SOB Stomach is upset  Did have some left over chicken before the illness hit  Wife has been home for 3 days Only got up yesterday Ate a bowl of cereal-- and slight lasagna 2 days ago  No fever No chills or sweats  No other Rx  Inconsistent with the prednisone--was down to 15mg  now  Current Outpatient Prescriptions on File Prior to Visit  Medication Sig Dispense Refill  . ADVAIR DISKUS 250-50 MCG/DOSE AEPB INHALE ONE DOSE BY MOUTH TWICE DAILY 120 each 5  . albuterol (PROAIR HFA) 108 (90 BASE) MCG/ACT inhaler Inhale 2 puffs into the lungs every 6 (six) hours as needed for wheezing. Reported on 02/26/2016    . Cholecalciferol (VITAMIN D PO) Take 1 tablet by mouth daily.    . Glucosamine HCl (GLUCOSAMINE PO) Take 1 tablet by mouth daily.    . Multiple Vitamin (MULTIVITAMIN) capsule Take 1 capsule by mouth daily.    . multivitamin-lutein (OCUVITE-LUTEIN) CAPS capsule Take 1 capsule by mouth daily.    . polyethylene glycol (MIRALAX / GLYCOLAX) packet Take 17 g by mouth daily as needed.     . predniSONE (DELTASONE) 10 MG tablet Take 1-2 tablets (10-20 mg total) by mouth daily with breakfast. 100 tablet 2  . tiotropium (SPIRIVA) 18 MCG inhalation capsule Place 1 capsule (18 mcg total) into inhaler and inhale daily. 90 capsule 3   No current facility-administered medications on file prior to visit.     Allergies  Allergen Reactions  . Morphine And Related     Past Medical History:  Diagnosis Date  . BPH (benign prostatic hypertrophy)   . Bronchiectasis (HCC)   . Osteoarthritis, multiple sites   . Pneumothorax on left 1995    Past Surgical History:  Procedure Laterality Date    . CATARACT EXTRACTION W/ INTRAOCULAR LENS  IMPLANT, BILATERAL    . MENISCUS REPAIR Left 1968  . TONSILLECTOMY    . TOTAL HIP ARTHROPLASTY  1999 & 2008  . UMBILICAL HERNIA REPAIR      Family History  Problem Relation Age of Onset  . Cancer Father   . Heart disease Neg Hx   . Diabetes Neg Hx   . Hypertension Neg Hx     Social History   Social History  . Marital status: Married    Spouse name: N/A  . Number of children: 0  . Years of education: N/A   Occupational History  . Sales of electronics     Retired 2013   Social History Main Topics  . Smoking status: Former Smoker    Years: 10.00    Types: Cigarettes    Quit date: 09/30/1967  . Smokeless tobacco: Never Used  . Alcohol use No  . Drug use: No  . Sexual activity: Not on file   Other Topics Concern  . Not on file   Social History Narrative   No living will   Requests wife to make health care decisions   Would accept resuscitation--  But no prolonged life support   Would not want tube feeds if cognitively unaware    Review of Systems  No vomiting  No diarrhea---actually no BM in 3 days    Objective:   Physical Exam  Constitutional:  Appears mildly ill  Neck: No thyromegaly present.  Cardiovascular: Regular rhythm and normal heart sounds.  Exam reveals no gallop.   No murmur heard. Slight tachycardia  Pulmonary/Chest:  Slightly bronchial sounds but no wheezing or crackles  Abdominal: Soft. He exhibits no distension. There is no tenderness. There is no rebound and no guarding.  Musculoskeletal: He exhibits no edema.  Lymphadenopathy:    He has no cervical adenopathy.          Assessment & Plan:

## 2016-07-23 NOTE — Assessment & Plan Note (Addendum)
Some respiratory difficulty Will check CXR  Chronic changes do not appear different than August Only mild productive cough--not clearly worse Will hold off on antibiotics

## 2016-07-30 DEATH — deceased

## 2016-07-31 ENCOUNTER — Telehealth: Payer: Self-pay | Admitting: Internal Medicine

## 2016-07-31 NOTE — Telephone Encounter (Signed)
Phone call with wife He had seemed some better and had a normal meal the day after my last visit. Then sitting on commode and just "went out" Passed on my condolences

## 2016-08-26 ENCOUNTER — Ambulatory Visit: Payer: Commercial Managed Care - HMO | Admitting: Pulmonary Disease

## 2016-09-25 ENCOUNTER — Ambulatory Visit: Payer: Commercial Managed Care - HMO | Admitting: Internal Medicine

## 2016-10-31 IMAGING — CR DG CHEST 2V
2 series · 2 of 2 positions shown · non-contrast
Comparison: 08/18/2014

CLINICAL DATA: Bronchiectasis with acute lower respiratory
infection

EXAM:
CHEST  2 VIEW

[view not recorded (1 of 2)]
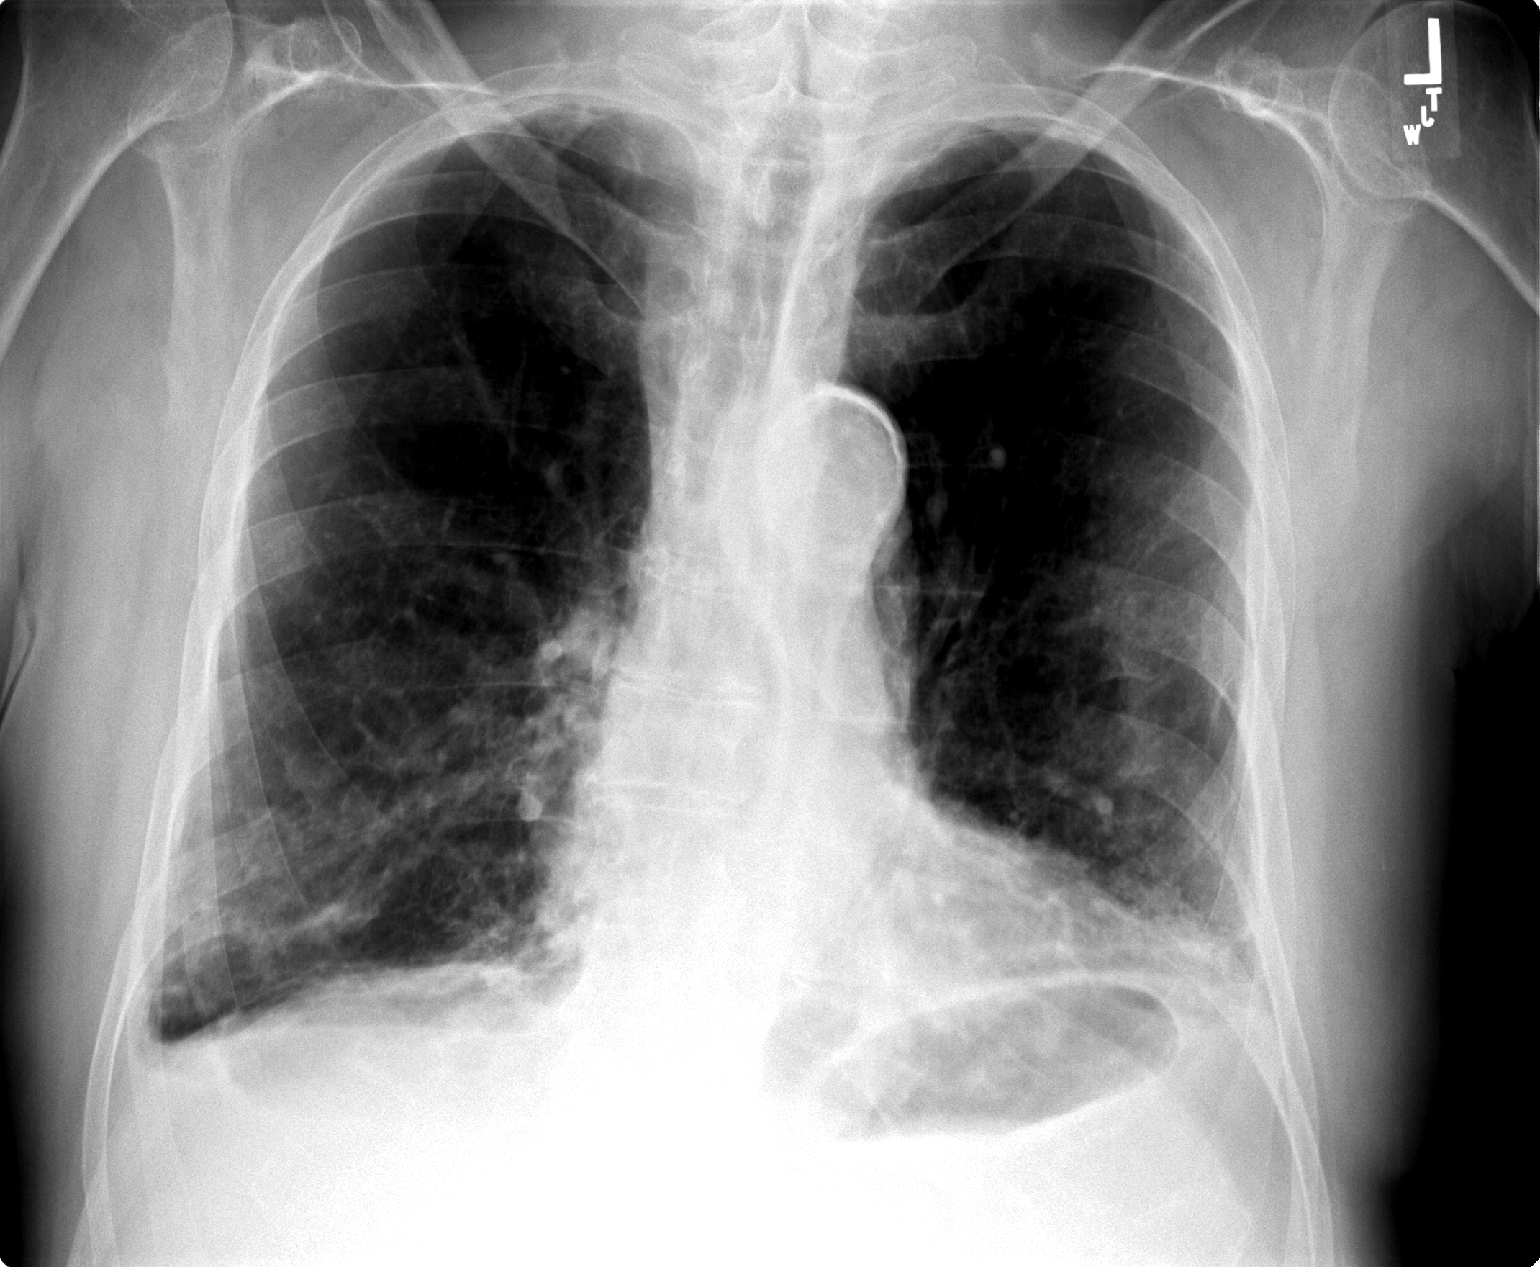

[view not recorded (2 of 2)]
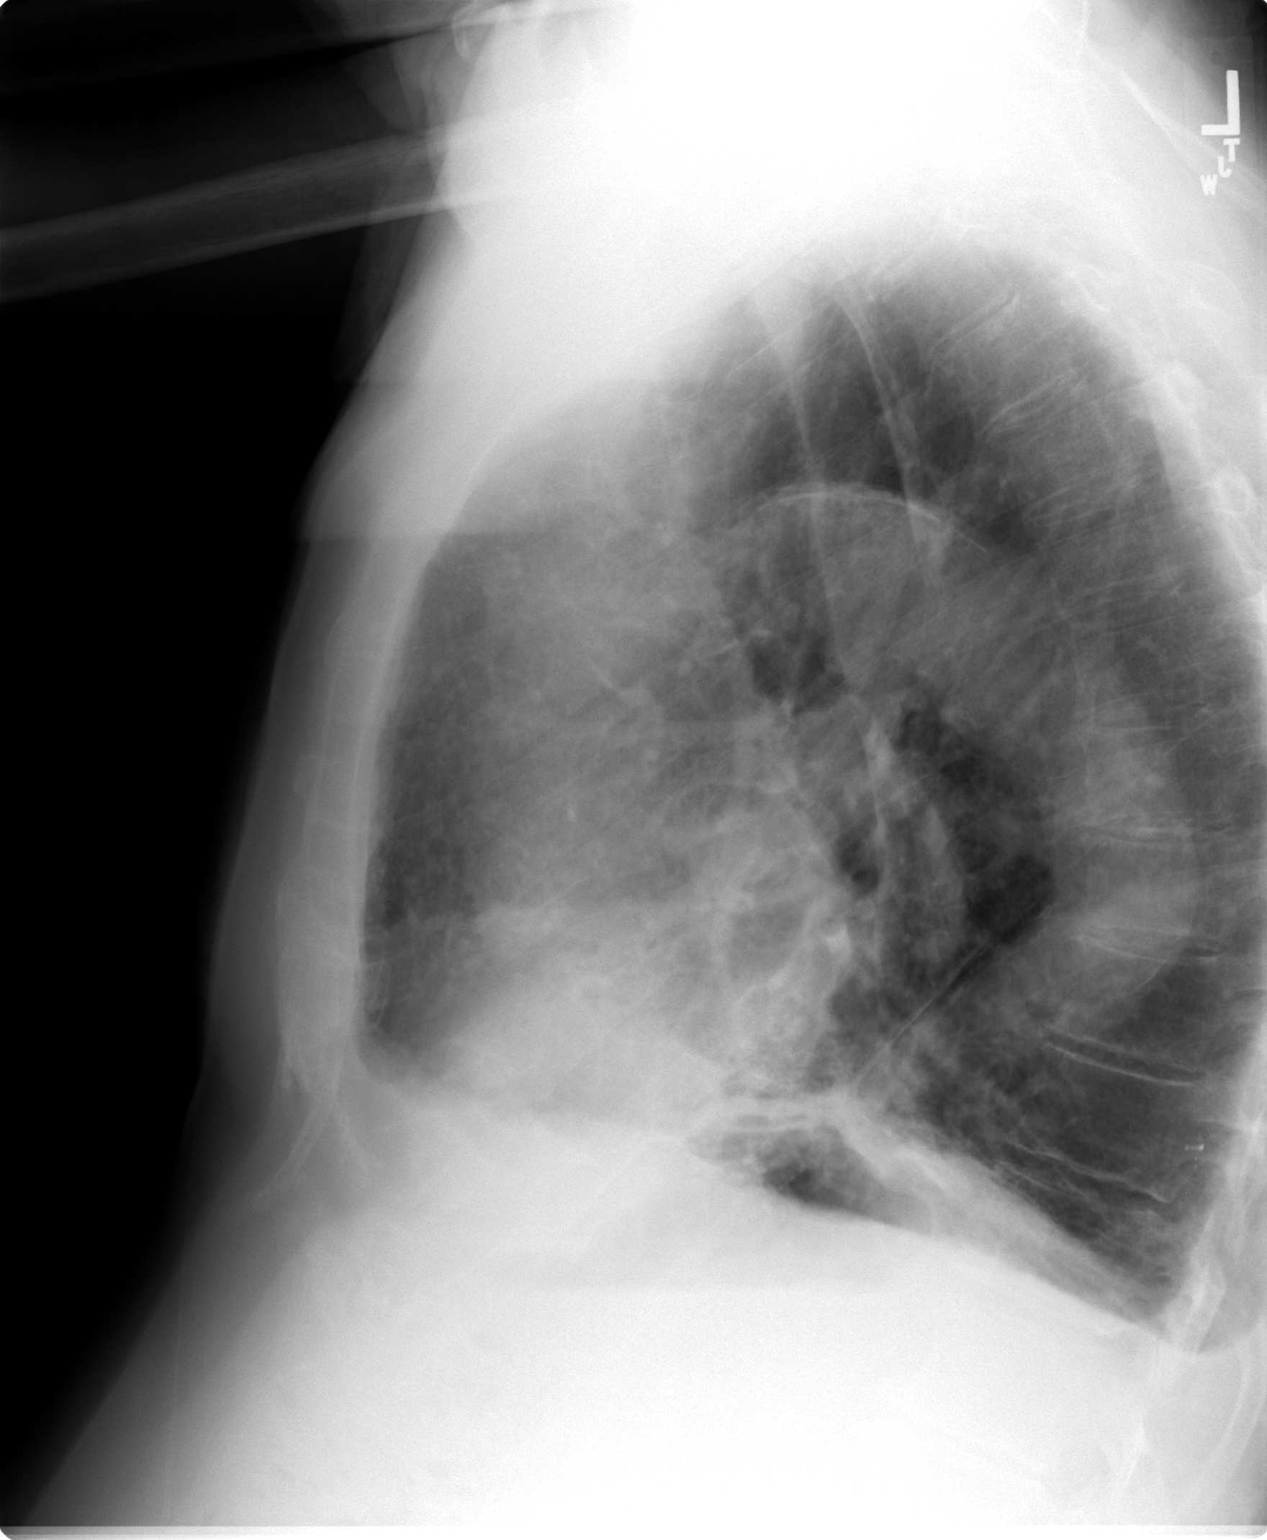

[2 of 2 positions shown; findings below may reference images not displayed]

FINDINGS: Bibasilar scarring is unchanged from the prior study. No
superimposed acute infiltrates. Small right effusion or pleural
scarring is unchanged.

Heart size is normal. Atherosclerotic aortic arch. Negative for
heart failure. COPD with hyperinflation.
IMPRESSION: COPD with bibasilar scarring unchanged. No superimposed acute
infiltrate.

## 2017-11-20 IMAGING — DX DG CHEST 2V
2 series · 2 of 2 positions shown · non-contrast
Comparison: 05/29/2016 .  09/04/2015.

CLINICAL DATA: Respiratory difficulty.

EXAM:
CHEST  2 VIEW

[chest pa]
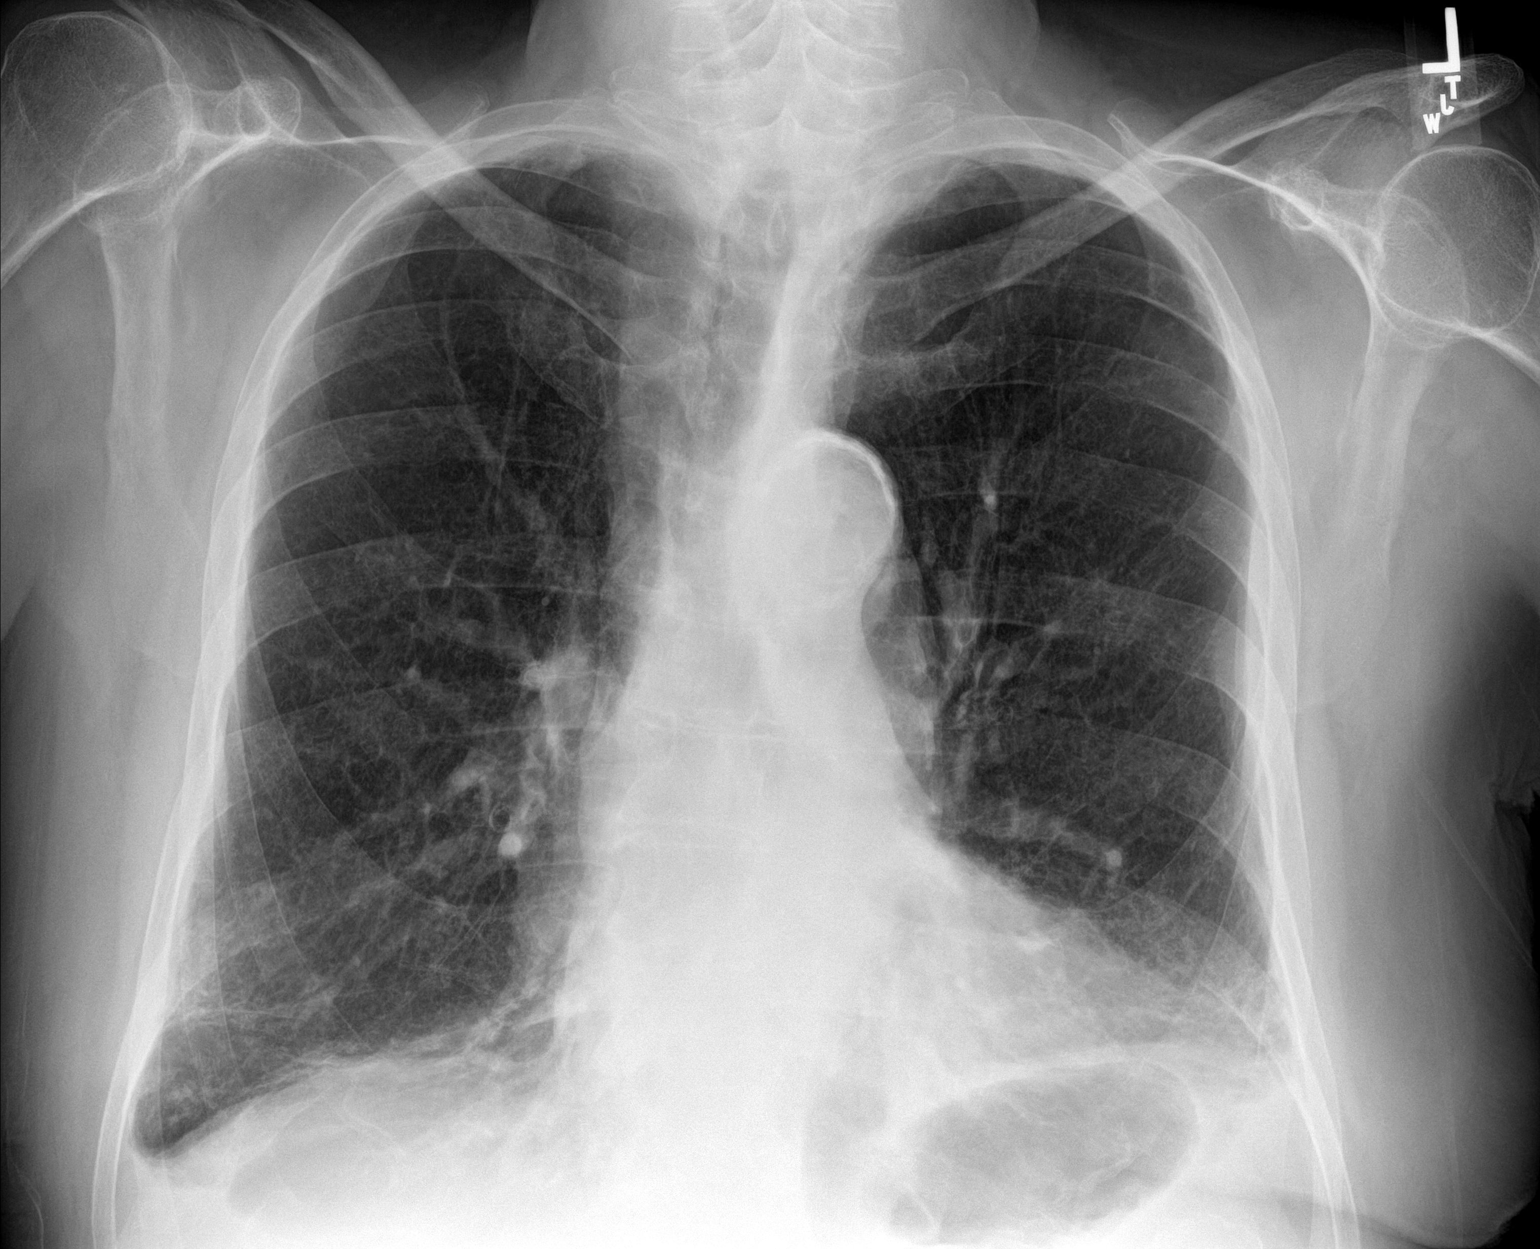

[chest lat]
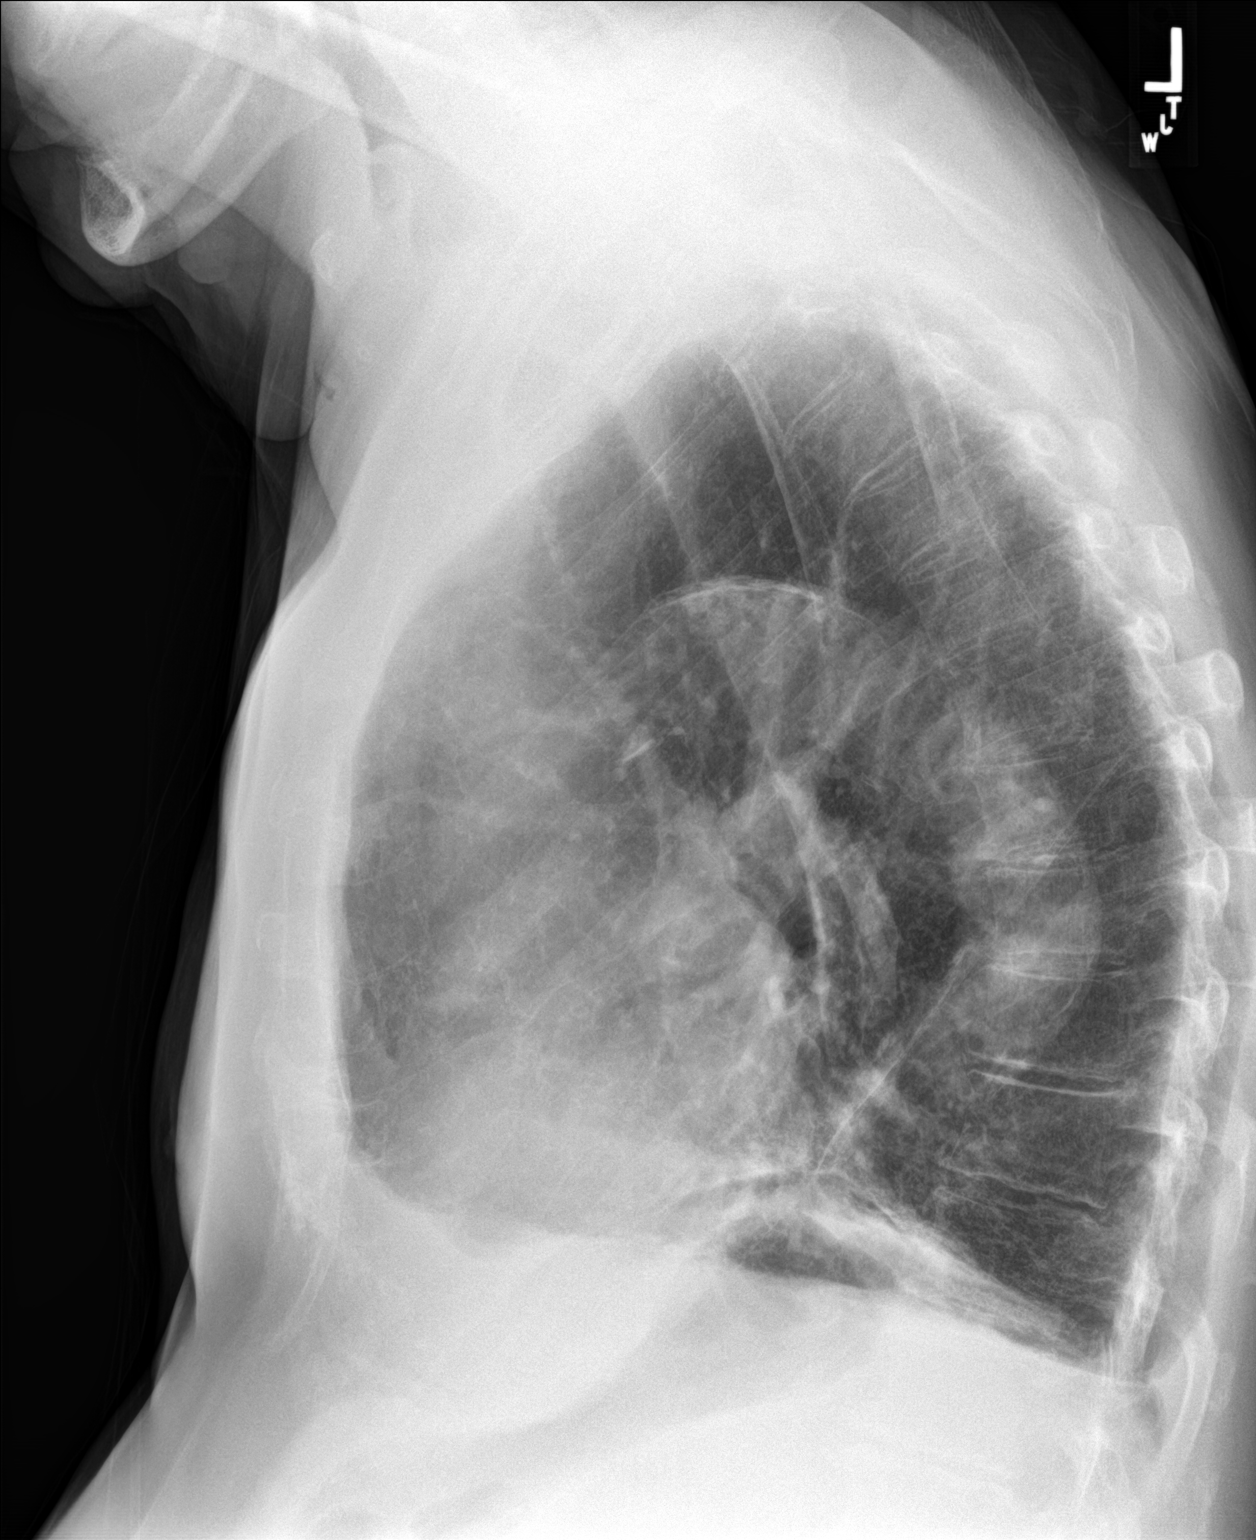

[2 of 2 positions shown; findings below may reference images not displayed]

FINDINGS: Mediastinum hilar structures are normal. Heart size stable. Stable
bilateral pleural parenchymal thickening consistent with atelectasis
and scarring. No acute infiltrate. No pneumothorax. No acute bony
abnormality.
IMPRESSION: Stable bilateral pleural-parenchymal thickening consistent with
atelectasis and scarring. No acute cardiopulmonary disease.
